# Patient Record
Sex: Male | Born: 1965 | Race: Black or African American | Hispanic: No | Marital: Single | Smoking: Never smoker
Health system: Southern US, Community
[De-identification: ages and names within clinical notes are randomized; demographics above are authoritative.]

## PROBLEM LIST (undated history)

## (undated) DIAGNOSIS — K429 Umbilical hernia without obstruction or gangrene: Secondary | ICD-10-CM

## (undated) DIAGNOSIS — M1990 Unspecified osteoarthritis, unspecified site: Secondary | ICD-10-CM

## (undated) DIAGNOSIS — M5430 Sciatica, unspecified side: Secondary | ICD-10-CM

## (undated) DIAGNOSIS — I10 Essential (primary) hypertension: Secondary | ICD-10-CM

## (undated) DIAGNOSIS — M549 Dorsalgia, unspecified: Secondary | ICD-10-CM

## (undated) HISTORY — PX: COLON SURGERY: SHX602

## (undated) HISTORY — DX: Unspecified osteoarthritis, unspecified site: M19.90

## (undated) HISTORY — DX: Dorsalgia, unspecified: M54.9

## (undated) HISTORY — DX: Umbilical hernia without obstruction or gangrene: K42.9

## (undated) HISTORY — DX: Sciatica, unspecified side: M54.30

## (undated) HISTORY — PX: KNEE SURGERY: SHX244

---

## 2018-09-06 ENCOUNTER — Encounter: Payer: Self-pay | Admitting: Emergency Medicine

## 2018-09-06 ENCOUNTER — Emergency Department: Payer: Self-pay

## 2018-09-06 ENCOUNTER — Other Ambulatory Visit: Payer: Self-pay

## 2018-09-06 ENCOUNTER — Emergency Department
Admission: EM | Admit: 2018-09-06 | Discharge: 2018-09-06 | Disposition: A | Discharge status: Home / Self Care | Payer: Self-pay | Attending: Emergency Medicine | Admitting: Emergency Medicine

## 2018-09-06 DIAGNOSIS — R5383 Other fatigue: Secondary | ICD-10-CM | POA: Insufficient documentation

## 2018-09-06 DIAGNOSIS — I10 Essential (primary) hypertension: Secondary | ICD-10-CM | POA: Insufficient documentation

## 2018-09-06 DIAGNOSIS — R079 Chest pain, unspecified: Secondary | ICD-10-CM

## 2018-09-06 DIAGNOSIS — R0600 Dyspnea, unspecified: Secondary | ICD-10-CM | POA: Insufficient documentation

## 2018-09-06 DIAGNOSIS — R0789 Other chest pain: Secondary | ICD-10-CM | POA: Insufficient documentation

## 2018-09-06 HISTORY — DX: Essential (primary) hypertension: I10

## 2018-09-06 LAB — CBC
HCT: 37.5 % — ABNORMAL LOW (ref 40.0–52.0)
Hemoglobin: 13.2 g/dL (ref 13.0–18.0)
MCH: 33 pg (ref 26.0–34.0)
MCHC: 35.1 g/dL (ref 32.0–36.0)
MCV: 94 fL (ref 80.0–100.0)
PLATELETS: 179 10*3/uL (ref 150–440)
RBC: 3.98 MIL/uL — AB (ref 4.40–5.90)
RDW: 13.3 % (ref 11.5–14.5)
WBC: 7.7 10*3/uL (ref 3.8–10.6)

## 2018-09-06 LAB — COMPREHENSIVE METABOLIC PANEL
ALBUMIN: 3.8 g/dL (ref 3.5–5.0)
ALT: 16 U/L (ref 0–44)
ANION GAP: 8 (ref 5–15)
AST: 20 U/L (ref 15–41)
Alkaline Phosphatase: 29 U/L — ABNORMAL LOW (ref 38–126)
BILIRUBIN TOTAL: 0.7 mg/dL (ref 0.3–1.2)
BUN: 20 mg/dL (ref 6–20)
CHLORIDE: 105 mmol/L (ref 98–111)
CO2: 24 mmol/L (ref 22–32)
Calcium: 8.6 mg/dL — ABNORMAL LOW (ref 8.9–10.3)
Creatinine, Ser: 1.22 mg/dL (ref 0.61–1.24)
GFR calc Af Amer: >60 mL/min (ref >60)
GFR calc non Af Amer: >60 mL/min (ref >60)
GLUCOSE: 121 mg/dL — AB (ref 70–99)
POTASSIUM: 3.5 mmol/L (ref 3.5–5.1)
Sodium: 137 mmol/L (ref 135–145)
TOTAL PROTEIN: 7.9 g/dL (ref 6.5–8.1)

## 2018-09-06 LAB — TROPONIN I

## 2018-09-06 NOTE — ED Notes (Signed)
Patient to xray.

## 2018-09-06 NOTE — ED Triage Notes (Signed)
Chest pain, SOB and fatigue x 2 days.

## 2018-09-06 NOTE — ED Provider Notes (Signed)
[facility] Emergency Department Provider Note  Time seen: 8:30 AM  I have reviewed the triage vital signs and the nursing notes.   HISTORY  Chief Complaint Chest Pain    HPI Jose Wood is a 52 y.o. male with a past medical history of hypertension presents to the emergency department for 2 days of shortness of breath intermittent chest discomfort and fatigue.  According to the patient for the past 2 days he has been feeling very tired and fatigued which she states is very abnormal for him.  He also states he has been feeling somewhat short of breath especially with prolonged exertion.  Normally is very fit and goes to the gym, but states he has been getting short of breath recently.  Also states intermittently he will have a very slight discomfort in the left chest although denies any currently.  No leg pain or swelling.  No nausea or diaphoresis.  Largely negative review of systems otherwise.  Does state very mild cough.   Past Medical History:  Diagnosis Date  . Hypertension     There are no active problems to display for this patient.   Past Surgical History:  Procedure Laterality Date  . COLON SURGERY    . KNEE SURGERY      Prior to Admission medications   Not on File    No Known Allergies  No family history on file.  Social History Social History   Tobacco Use  . Smoking status: Never Smoker  Substance Use Topics  . Alcohol use: Not on file  . Drug use: Not on file    Review of Systems Constitutional: Negative for fever. ENT: Negative for recent illness/congestion Cardiovascular: Intermittent slight left chest discomfort Respiratory: Positive for shortness of breath with exertion only Gastrointestinal: Negative for abdominal pain.  Negative for nausea vomiting diarrhea Genitourinary: Negative for urinary compaints Musculoskeletal: Negative for leg pain or swelling Skin: Negative for skin complaints  Neurological: Negative for  headache All other ROS negative  ____________________________________________   PHYSICAL EXAM:  VITAL SIGNS: ED Triage Vitals  Enc Vitals Group     BP 09/06/18 0747 130/88     Pulse Rate 09/06/18 0747 71     Resp 09/06/18 0747 18     Temp 09/06/18 0747 98.1 F (36.7 C)     Temp Source 09/06/18 0747 Oral     SpO2 09/06/18 0747 98 %     Weight 09/06/18 0748 257 lb (116.6 kg)     Height 09/06/18 0748 6\' 2"  (1.88 m)     Head Circumference --      Peak Flow --      Pain Score 09/06/18 0748 4     Pain Loc --      Pain Edu? --      Excl. in GC? --     Constitutional: Alert and oriented. Well appearing and in no distress. Eyes: Normal exam ENT   Head: Normocephalic and atraumatic   Mouth/Throat: Mucous membranes are moist. Cardiovascular: Normal rate, regular rhythm. No murmur Respiratory: Normal respiratory effort without tachypnea nor retractions. Breath sounds are clear and equal bilaterally.  Gastrointestinal: Soft and nontender. No distention Musculoskeletal: Nontender with normal range of motion in all extremities.  No lower extremity edema or tenderness. Neurologic:  Normal speech and language. No gross focal neurologic deficits are appreciated. Skin:  Skin is warm, dry and intact.  Psychiatric: Mood and affect are normal. Speech and behavior are normal.   ____________________________________________  EKG  EKG reviewed and interpreted by myself shows normal sinus rhythm at 67 bpm with a narrow QRS, normal axis, normal intervals, no ST changes.  Normal EKG.  ____________________________________________    RADIOLOGY  Chest x-ray is negative  ____________________________________________   INITIAL IMPRESSION / ASSESSMENT AND PLAN / ED COURSE  Pertinent labs & imaging results that were available during my care of the patient were reviewed by me and considered in my medical decision making (see chart for details).  Patient presents emergency department  for shortness of breath with exertion, fatigue and intermittent left chest discomfort over the past several days.  Currently the patient appears well, no distress.  Differential would include ACS, CHF, pneumonia, pneumothorax, URI.  We will check labs, chest x-ray and continue to closely monitor.  Patient is EKG is reassuring.  Chest x-ray is negative.  Labs are largely within normal limits including a negative troponin.  It is not entirely clear the cause of the patient's symptoms, could possibly be viral type infection.  We will have the patient follow-up with his primary care doctor.  ____________________________________________   FINAL CLINICAL IMPRESSION(S) / ED DIAGNOSES  Dyspnea Chest pain    Minna AntisPaduchowski, [name], MD 09/06/18 [phone]

## 2018-09-24 DIAGNOSIS — I10 Essential (primary) hypertension: Secondary | ICD-10-CM | POA: Insufficient documentation

## 2018-09-24 DIAGNOSIS — M5489 Other dorsalgia: Secondary | ICD-10-CM | POA: Insufficient documentation

## 2018-10-15 ENCOUNTER — Ambulatory Visit (INDEPENDENT_AMBULATORY_CARE_PROVIDER_SITE_OTHER): Payer: Self-pay | Admitting: Surgery

## 2018-10-15 ENCOUNTER — Other Ambulatory Visit: Payer: Self-pay

## 2018-10-15 ENCOUNTER — Encounter: Payer: Self-pay | Admitting: Surgery

## 2018-10-15 VITALS — BP 141/91 | HR 65 | Temp 98.3°F | Ht 74.0 in | Wt 266.0 lb

## 2018-10-15 DIAGNOSIS — K432 Incisional hernia without obstruction or gangrene: Secondary | ICD-10-CM | POA: Insufficient documentation

## 2018-10-15 NOTE — Progress Notes (Signed)
10/15/2018  Reason for Visit:  Incisional hernia  Referring Provider:  Einar Crow, MD  History of Present Illness: Jose Wood is a 52 y.o. male sent for evaluation of an incisional hernia.  The patient reports that in 2017 he had emergency exploratory laparotomy for bowel perforation, but he is unsure what segment was perforated.  He had a left sided colostomy which was eventually reversed in 2017 as well.  He has noticed since last year that he's developed a bulging at his midline that protrudes towards the right side of his abdomen.  However, he remains asymptomatic from it.  He was told initially by his surgery team that it was too early to fix anything and if it was asymptomatic, there was no urgency either.  However, he believes it is getting slowly larger in size.  He wears an abdominal binder to help with the bulging.  He does some lifting at work and does work out often.  He denies any nausea, vomiting, abdominal pain, constipation, or diarrhea.  Past Medical History: Past Medical History:  Diagnosis Date  . Back pain with sciatica   . Hypertension   . Osteoarthritis   . Umbilical hernia      Past Surgical History: Past Surgical History:  Procedure Laterality Date  . COLON SURGERY    . KNEE SURGERY      Home Medications: Prior to Admission medications   Medication Sig Start Date End Date Taking? Authorizing Provider  baclofen (LIORESAL) 10 MG tablet Take 10 mg by mouth at bedtime as needed for muscle spasms. 07/21/18   [provider]  lisinopril-hydrochlorothiazide (PRINZIDE,ZESTORETIC) 20-25 MG tablet Take 1 tablet by mouth daily. 08/30/18   [provider]  meloxicam (MOBIC) 15 MG tablet Take 15 mg by mouth daily. 08/19/18   [provider]  Multiple Vitamins-Minerals (MENS MULTIVITAMIN PLUS PO) Take 1 tablet by mouth daily.    [provider]    Allergies: No Known Allergies  Social History:  reports that he has never smoked.  He has never used smokeless tobacco. He reports that he does not drink alcohol or use drugs.   Family History: Family History  Problem Relation Age of Onset  . Cancer Mother   . Stroke Paternal Grandmother     Review of Systems: Review of Systems  Constitutional: Negative for chills and fever.  HENT: Negative for hearing loss.   Eyes: Negative for blurred vision.  Respiratory: Negative for shortness of breath.   Cardiovascular: Negative for chest pain.  Gastrointestinal: Negative for abdominal pain, constipation, diarrhea, nausea and vomiting.  Genitourinary: Negative for dysuria.  Musculoskeletal: Negative for myalgias.  Skin: Negative for rash.  Neurological: Negative for dizziness.  Psychiatric/Behavioral: Negative for depression.    Physical Exam BP (!) 141/91   Pulse 65   Temp 98.3 F (36.8 C) (Temporal)   Ht 6\' 2"  (1.88 m)   Wt 266 lb (120.7 kg)   BMI 34.15 kg/m  CONSTITUTIONAL: No acute distress HEENT:  Normocephalic, atraumatic, extraocular motion intact. NECK: Trachea is midline, and there is no jugular venous distension.  RESPIRATORY:  Lungs are clear, and breath sounds are equal bilaterally. Normal respiratory effort without pathologic use of accessory muscles. CARDIOVASCULAR: Heart is regular without murmurs, gallops, or rubs. GI: The abdomen is soft, nondistended, nontender to palpation.  The patient has a moderate size incisional hernia centered around the umbilicus.  He has a large midline scar from subxyphoid to pubis and a well healed left colostomy scar without  hernia. The incisional hernia is reducible and without any tenderness on palpation. MUSCULOSKELETAL:  Normal muscle strength and tone in all four extremities.  No peripheral edema or cyanosis. SKIN: Skin turgor is normal. There are no pathologic skin lesions.  NEUROLOGIC:  Motor and sensation is grossly normal.  Cranial nerves are grossly intact. PSYCH:  Alert and oriented to person, place and time.  Affect is normal.  Laboratory Analysis: No results found for this or any previous visit (from the past 24 hour(s)).  Imaging: No results found.  Assessment and Plan: [name] with an incisional hernia from prior exploratory laparotomy.  From the patient's description, I imagine he had an episode of perforated diverticulitis requiring Hartmann's procedure and eventual colostomy takedown.  However, we do not have medical records available.  Discussed with the patient that he has an incisional hernia and discussed with him what this means and what risks it poses.  Discussed with him that since he is currently asymptomatic, there is no urgency to repair the hernia as initially he seemed against surgical intervention.  He would not want another big incision for his hernia repair.  Discussed with the patient that it is possible to do this hernia repair via laparoscopy, with mesh placement, but that I could not guarantee that an open procedure would not be required, and this would be an OR time decision.  Discussed with the patient that robotic surgery may give more feasibility for dissection of the hernia contents and closure of his defect and mesh placement, but also this would not guarantee that there would not be an open procedure.  The patient does want to explore options as he is more hesitant about an open procedure given that his recovery from his other two surgeries took a long time.  He would want to discuss the option of robotic surgery.  I informed him that my partner Jose Wood, would be happy to see him for another opinion.  Then if he decides to proceed with surgery, either myself or my partner would be happy to help him.   We will set up appointment with Jose Wood.  Face-to-face time spent with the patient and care providers was 60 minutes, with more than 50% of the time spent counseling, educating, and coordinating care of the patient.     Jose Ill, MD [facility]

## 2018-10-15 NOTE — Patient Instructions (Addendum)
Please see your follow up appointment listed below.  °

## 2018-10-30 ENCOUNTER — Ambulatory Visit (INDEPENDENT_AMBULATORY_CARE_PROVIDER_SITE_OTHER): Payer: PRIVATE HEALTH INSURANCE | Admitting: Surgery

## 2018-10-30 ENCOUNTER — Encounter: Payer: Self-pay | Admitting: Surgery

## 2018-10-30 ENCOUNTER — Encounter: Payer: Self-pay | Admitting: *Deleted

## 2018-10-30 VITALS — BP 142/95 | HR 80 | Temp 97.9°F | Ht 73.5 in | Wt 269.0 lb

## 2018-10-30 DIAGNOSIS — K432 Incisional hernia without obstruction or gangrene: Secondary | ICD-10-CM | POA: Diagnosis not present

## 2018-10-30 NOTE — Patient Instructions (Addendum)
We will follow up after your CT scan.

## 2018-10-30 NOTE — Progress Notes (Signed)
Patient has been scheduled for a CT abdomen/pelvis with contrast at [place] for 11-01-18 at 9 am (arrive 8:45 am). Prep: NPO 4 hours prior and pick up prep kit. Patient verbalizes understanding.  The patient will follow up with Dr. Dahlia Byes in the office in 2 weeks as scheduled.   I have faxed a medical records request to [facility] to see if we can obtain records from the 2017 colon surgeries done around March and September.

## 2018-10-30 NOTE — Progress Notes (Signed)
Patient ID: Jose Wood, male   DOB: Nov 07, 1966, 52 y.o.   MRN: [id number]  HPI Jose Wood is a 52 y.o. male recently seen by my partner Dr. Aleen Campi and requesting my opinion regarding operative approach for his complex abdominal wall defect.  Patient reports that 2 years ago he had an emergency surgery for perforated diverticulitis at [facility] requiring Hartman's procedure and prolonged hospitalization.  He had a colostomy takedown subsequently and did well but developed a ventral hernia.  He reports that over the last couple years his hernia defect has increased in size there is some mild discomfort but no major pain. He is able to perform more than 4 METS of activity without any shortness of breath or chest pain.  Is not diabetic and he does not smoke.  There is a binder for his abdominal comfort.  HPI  Past Medical History:  Diagnosis Date  . Back pain with sciatica   . Hypertension   . Osteoarthritis   . Umbilical hernia     Past Surgical History:  Procedure Laterality Date  . COLON SURGERY    . KNEE SURGERY      Family History  Problem Relation Age of Onset  . Cancer Mother   . Stroke Paternal Grandmother     Social History Social History   Tobacco Use  . Smoking status: Never Smoker  . Smokeless tobacco: Never Used  Substance Use Topics  . Alcohol use: Never    Frequency: Never  . Drug use: Never    No Known Allergies  Current Outpatient Medications  Medication Sig Dispense Refill  . baclofen (LIORESAL) 10 MG tablet     . lisinopril-hydrochlorothiazide (PRINZIDE,ZESTORETIC) 20-25 MG tablet     . meloxicam (MOBIC) 15 MG tablet Take 15 mg by mouth daily.  1  . Multiple Vitamins-Minerals (MENS MULTIVITAMIN PLUS PO) Take 1 tablet by mouth daily.     No current facility-administered medications for this visit.      Review of Systems Full ROS  was asked and was negative except for the information on the HPI  Physical Exam Blood pressure (!) 142/95,  pulse 80, temperature 97.9 F (36.6 C), temperature source Temporal, height 6' 1.5" (1.867 m), weight 269 lb (122 kg). CONSTITUTIONAL: NAD EYES: Pupils are equal, round, and reactive to light, Sclera are non-icteric. EARS, NOSE, MOUTH AND THROAT: The oropharynx is clear. The oral mucosa is pink and moist. Hearing is intact to voice. LYMPH NODES:  Lymph nodes in the neck are normal. RESPIRATORY:  Lungs are clear. There is normal respiratory effort, with equal breath sounds bilaterally, and without pathologic use of accessory muscles. CARDIOVASCULAR: Heart is regular without murmurs, gallops, or rubs. GI: The abdomen is soft, nontender, and nondistended. There is a large abdominal wall defect measuring 15 x 12 cm.  There is loss of domain and retraction of the rectus abdominal muscle laterally.  No peritonitis, no infection  There are no palpable masses. There is no hepatosplenomegaly. There are normal bowel sounds in all quadrants. GU: Rectal deferred.   MUSCULOSKELETAL: Normal muscle strength and tone. No cyanosis or edema.   SKIN: Turgor is good and there are no pathologic skin lesions or ulcers. NEUROLOGIC: Motor and sensation is grossly normal. Cranial nerves are grossly intact. PSYCH:  Oriented to person, place and time. Affect is normal.  Data Reviewed I have personally reviewed the patient's imaging, laboratory findings and medical records.    Assessment/Plan 52 year old male with large abdominal wall defect.  I will obtain records from [facility] regarding his previous operations.  Given the size of the defect I do think that he will likely require abdominal wall reconstruction.  I will obtain a CT scan of the abdomen and pelvis to evaluate his abdominal wall anatomy. Had a lengthy discussion with patient regarding different approaches to ventral hernia surgery.  Including but not limited to robotic approach is medically invasive as well as a component separation.  Extensive counseling  was provided  Time spent with the patient was , with more than 50% of the time spent in face-to-face education, counseling and care coordination.     Sterling Big, MD FACS General Surgeon 10/30/2018, 1:13 PM

## 2018-11-01 ENCOUNTER — Ambulatory Visit
Admission: RE | Admit: 2018-11-01 | Discharge: 2018-11-01 | Disposition: A | Discharge status: Home / Self Care | Payer: PRIVATE HEALTH INSURANCE | Source: Ambulatory Visit | Attending: Surgery | Admitting: Surgery

## 2018-11-01 DIAGNOSIS — K432 Incisional hernia without obstruction or gangrene: Secondary | ICD-10-CM | POA: Insufficient documentation

## 2018-11-01 DIAGNOSIS — N281 Cyst of kidney, acquired: Secondary | ICD-10-CM | POA: Insufficient documentation

## 2018-11-01 DIAGNOSIS — K439 Ventral hernia without obstruction or gangrene: Secondary | ICD-10-CM | POA: Insufficient documentation

## 2018-11-01 DIAGNOSIS — I700 Atherosclerosis of aorta: Secondary | ICD-10-CM | POA: Diagnosis not present

## 2018-11-01 MED ORDER — IOPAMIDOL (ISOVUE-300) INJECTION 61%
100.0000 mL | Freq: Once | INTRAVENOUS | Status: AC | PRN
  Administered 2018-11-01: 100 mL via INTRAVENOUS

## 2018-11-04 ENCOUNTER — Encounter: Payer: Self-pay | Admitting: *Deleted

## 2018-11-04 NOTE — Progress Notes (Signed)
Records received from [facility] and given to Irving Burton to put with chart for patient's upcoming appointment with Dr. Everlene Farrier on 11-13-18 at 9 am.

## 2018-11-13 ENCOUNTER — Encounter: Payer: Self-pay | Admitting: Surgery

## 2018-11-13 ENCOUNTER — Ambulatory Visit (INDEPENDENT_AMBULATORY_CARE_PROVIDER_SITE_OTHER): Payer: PRIVATE HEALTH INSURANCE | Admitting: Surgery

## 2018-11-13 ENCOUNTER — Other Ambulatory Visit: Payer: Self-pay

## 2018-11-13 VITALS — BP 130/86 | HR 73 | Temp 97.7°F | Ht 72.0 in | Wt 268.0 lb

## 2018-11-13 DIAGNOSIS — K432 Incisional hernia without obstruction or gangrene: Secondary | ICD-10-CM

## 2018-11-13 NOTE — Progress Notes (Signed)
F/U for large ventral hernia w loss of domain. Ct scan pers reviewed and images shared in detail w the pat. Loss of domain, there is a second hernia where the stoma used to be.  I had a lengthy d/w the pt about his situation. Given the massive size of the hernia, the second component from the previous ostomy and loss of domain I do recommend Open ventral hernia repair with Component separation TAR. D/W the pt at length about what this will entail. THis would be a big operation, risks, benefits and possible complications d/w the pt in detail.  For now e wishes to think about it a call us back after the holidays. I spent at least 25 min  In this encounter w > 50% spent in coordination and counseling of his care.

## 2018-11-13 NOTE — Patient Instructions (Signed)
Patient to call back and scheduled surgery when he is ready .

## 2019-02-26 ENCOUNTER — Telehealth: Payer: Self-pay | Admitting: *Deleted

## 2019-02-26 ENCOUNTER — Other Ambulatory Visit
Admission: RE | Admit: 2019-02-26 | Discharge: 2019-02-26 | Disposition: A | Discharge status: Home / Self Care | Payer: PRIVATE HEALTH INSURANCE | Source: Ambulatory Visit | Attending: Surgery | Admitting: Surgery

## 2019-02-26 ENCOUNTER — Ambulatory Visit (INDEPENDENT_AMBULATORY_CARE_PROVIDER_SITE_OTHER): Payer: PRIVATE HEALTH INSURANCE | Admitting: Surgery

## 2019-02-26 ENCOUNTER — Encounter: Payer: Self-pay | Admitting: Surgery

## 2019-02-26 ENCOUNTER — Ambulatory Visit
Admission: RE | Admit: 2019-02-26 | Discharge: 2019-02-26 | Disposition: A | Discharge status: Home / Self Care | Payer: PRIVATE HEALTH INSURANCE | Source: Ambulatory Visit | Attending: Surgery | Admitting: Surgery

## 2019-02-26 ENCOUNTER — Other Ambulatory Visit: Payer: Self-pay

## 2019-02-26 VITALS — BP 144/95 | HR 73 | Temp 97.7°F | Ht 73.5 in | Wt 264.8 lb

## 2019-02-26 DIAGNOSIS — K439 Ventral hernia without obstruction or gangrene: Secondary | ICD-10-CM | POA: Diagnosis not present

## 2019-02-26 DIAGNOSIS — R9431 Abnormal electrocardiogram [ECG] [EKG]: Secondary | ICD-10-CM | POA: Insufficient documentation

## 2019-02-26 DIAGNOSIS — Z01810 Encounter for preprocedural cardiovascular examination: Secondary | ICD-10-CM

## 2019-02-26 DIAGNOSIS — Z01818 Encounter for other preprocedural examination: Secondary | ICD-10-CM | POA: Insufficient documentation

## 2019-02-26 LAB — COMPREHENSIVE METABOLIC PANEL
ALBUMIN: 4.5 g/dL (ref 3.5–5.0)
ALT: 19 U/L (ref 0–44)
AST: 25 U/L (ref 15–41)
Alkaline Phosphatase: 35 U/L — ABNORMAL LOW (ref 38–126)
Anion gap: 10 (ref 5–15)
BUN: 19 mg/dL (ref 6–20)
CHLORIDE: 101 mmol/L (ref 98–111)
CO2: 27 mmol/L (ref 22–32)
CREATININE: 1.21 mg/dL (ref 0.61–1.24)
Calcium: 9.3 mg/dL (ref 8.9–10.3)
GFR calc non Af Amer: >60 mL/min (ref >60)
GLUCOSE: 99 mg/dL (ref 70–99)
Potassium: 4.3 mmol/L (ref 3.5–5.1)
SODIUM: 138 mmol/L (ref 135–145)
Total Bilirubin: 0.8 mg/dL (ref 0.3–1.2)
Total Protein: 8.3 g/dL — ABNORMAL HIGH (ref 6.5–8.1)

## 2019-02-26 LAB — CBC WITH DIFFERENTIAL/PLATELET
ABS IMMATURE GRANULOCYTES: 0.01 10*3/uL (ref 0.00–0.07)
BASOS ABS: 0 10*3/uL (ref 0.0–0.1)
BASOS PCT: 0 %
Eosinophils Absolute: 0.1 10*3/uL (ref 0.0–0.5)
Eosinophils Relative: 2 %
HCT: 42.4 % (ref 39.0–52.0)
Hemoglobin: 14.7 g/dL (ref 13.0–17.0)
IMMATURE GRANULOCYTES: 0 %
Lymphocytes Relative: 38 %
Lymphs Abs: 2.5 10*3/uL (ref 0.7–4.0)
MCH: 32 pg (ref 26.0–34.0)
MCHC: 34.7 g/dL (ref 30.0–36.0)
MCV: 92.4 fL (ref 80.0–100.0)
Monocytes Absolute: 0.6 10*3/uL (ref 0.1–1.0)
Monocytes Relative: 9 %
NEUTROS ABS: 3.4 10*3/uL (ref 1.7–7.7)
NEUTROS PCT: 51 %
PLATELETS: 205 10*3/uL (ref 150–400)
RBC: 4.59 MIL/uL (ref 4.22–5.81)
RDW: 12.2 % (ref 11.5–15.5)
WBC: 6.6 10*3/uL (ref 4.0–10.5)
nRBC: 0 % (ref 0.0–0.2)

## 2019-02-26 LAB — PROTIME-INR
INR: 1 (ref 0.8–1.2)
Prothrombin Time: 13.5 seconds (ref 11.4–15.2)

## 2019-02-26 NOTE — Telephone Encounter (Signed)
Patient to have the following done at [facility] today: CMP, CBC, INR, chest x-ray, and EKG.   Patient's surgery has been scheduled for 03-27-19 at at [facility] with Dr. Everlene Farrier. Dr. Aleen Campi to assist.   The patient is aware to Pre-Admit on 03-17-19 at 8 am. Patient will [facility] in [facility], [address] (first floor).   The patient is aware to call the office should he have further questions.   *The patient was referred to [facility] and was scheduled for an appointment to see Jennet Maduro, PA (under Dr. Hyacinth Meeker) for 03-05-19 at 8:30 am. Patient reports that he went to urgent care today for left shoulder and does not need this appointment. I will call to cancel tomorrow when they are back in the office. Note to Dr. Everlene Farrier.

## 2019-02-26 NOTE — Patient Instructions (Addendum)
Patient will need to be scheduled for surgery for hernia repair. He will need to have a Chest Xray, EKG and lab work (CMP,CBC,INR). Refer patient to ortho for left shoulder pain. (Dr.Miller or Dr.Poggi)  Call the office with any questions or concerns.      Open Hernia Repair, Adult  Open hernia repair is a surgical procedure to fix a hernia. A hernia occurs when an internal organ or tissue pushes out through a weak spot in the abdominal wall muscles. Hernias commonly occur in the groin and around the navel. Most hernias tend to get worse over time. Often, surgery is done to prevent the hernia from becoming bigger, uncomfortable, or an emergency. Emergency surgery may be needed if abdominal contents get stuck in the opening (incarcerated hernia) or the blood supply gets cut off (strangulated hernia). In an open repair, an incision is made in the abdomen to perform the surgery. Tell a health care provider about:  Any allergies you have.  All medicines you are taking, including vitamins, herbs, eye drops, creams, and over-the-counter medicines.  Any problems you or family members have had with anesthetic medicines.  Any blood or bone disorders you have.  Any surgeries you have had.  Any medical conditions you have, including any recent cold or flu symptoms.  Whether you are pregnant or may be pregnant. What are the risks? Generally, this is a safe procedure. However, problems may occur, including:  Long-lasting (chronic) pain.  Bleeding.  Infection.  Damage to the testicle. This can cause shrinking or swelling.  Damage to the bladder, blood vessels, intestine, or nerves near the hernia.  Trouble passing urine.  Allergic reactions to medicines.  Return of the hernia. What happens before the procedure? Staying hydrated Follow instructions from your health care provider about hydration, which may include:  Up to 2 hours before the procedure - you may continue to drink clear  liquids, such as water, clear fruit juice, black coffee, and plain tea. Eating and drinking restrictions Follow instructions from your health care provider about eating and drinking, which may include:  8 hours before the procedure - stop eating heavy meals or foods such as meat, fried foods, or fatty foods.  6 hours before the procedure - stop eating light meals or foods, such as toast or cereal.  6 hours before the procedure - stop drinking milk or drinks that contain milk.  2 hours before the procedure - stop drinking clear liquids. Medicines  Ask your health care provider about: ? Changing or stopping your regular medicines. This is especially important if you are taking diabetes medicines or blood thinners. ? Taking medicines such as aspirin and ibuprofen. These medicines can thin your blood. Do not take these medicines before your procedure if your health care provider instructs you not to.  You may be given antibiotic medicine to help prevent infection. General instructions  You may have blood tests or imaging studies.  Ask your health care provider how your surgical site will be marked or identified.  If you smoke, do not smoke for at least 2 weeks before your procedure or for as long as told by your health care provider.  Let your health care provider know if you develop a cold or any infection before your surgery.  Plan to have someone take you home from the hospital or clinic.  If you will be going home right after the procedure, plan to have someone with you for 24 hours. What happens during the procedure?  To reduce your risk of infection: ? Your health care team will wash or sanitize their hands. ? Your skin will be washed with soap. ? Hair may be removed from the surgical area.  An IV tube will be inserted into one of your veins.  You will be given one or more of the following: ? A medicine to help you relax (sedative). ? A medicine to numb the area (local  anesthetic). ? A medicine to make you fall asleep (general anesthetic).  Your surgeon will make an incision over the hernia.  The tissues of the hernia will be moved back into place.  The edges of the hernia may be stitched together.  The opening in the abdominal muscles will be closed with stitches (sutures). Or, your surgeon will place a mesh patch made of manmade (synthetic) material over the opening.  The incision will be closed.  A bandage (dressing) may be placed over the incision. The procedure may vary among health care providers and hospitals. What happens after the procedure?  Your blood pressure, heart rate, breathing rate, and blood oxygen level will be monitored until the medicines you were given have worn off.  You may be given medicine for pain.  Do not drive for 24 hours if you received a sedative. This information is not intended to replace advice given to you by your health care provider. Make sure you discuss any questions you have with your health care provider. Document Released: 06/06/2001 Document Revised: 06/30/2016 Document Reviewed: 05/24/2016 Elsevier Interactive Patient Education  2019 [employer].

## 2019-02-27 ENCOUNTER — Encounter: Payer: Self-pay | Admitting: *Deleted

## 2019-02-27 ENCOUNTER — Telehealth: Payer: Self-pay

## 2019-02-27 NOTE — Progress Notes (Signed)
I did call [facility] this morning and speak with Robin. She cancelled patient's appointment with Jennet Maduro, PA for next week.

## 2019-02-27 NOTE — Telephone Encounter (Signed)
Called patient to inform him of his results per Dr.Pabon unable to LVM due to mailbox being full.

## 2019-02-27 NOTE — Progress Notes (Signed)
Patient has been informed of his lab results and cxr.

## 2019-02-28 ENCOUNTER — Encounter: Payer: Self-pay | Admitting: Surgery

## 2019-02-28 NOTE — Progress Notes (Signed)
Surgical Consultation  02/28/2019  Jose Wood is an 53 y.o. male.   Chief Complaint  Patient presents with  . Follow-up    f/u hernia, to discuss possibly scheduling hernia surgery     HPI: 53 year old male well-known to me with a prior history of perforated diverticulitis requiring emergent Hartmann procedure at outside hospital and colostomy takedown consequently.  I had had an encounter with the patient before explaining to them about the need for abdominal wall reconstruction given the massive hernia on the loss of domain.  This will reinstitute the abdominal wall anatomy on dynamics. He is otherwise doing well.  No fevers no chills no evidence of recent heart attack strokes.  He does report some left shoulder pain.  Pain is intermittent and worsening with movement.  He does have decreased range of motion on the left side  Past Medical History:  Diagnosis Date  . Back pain with sciatica   . Hypertension   . Osteoarthritis   . Umbilical hernia     Past Surgical History:  Procedure Laterality Date  . COLON SURGERY    . KNEE SURGERY      Family History  Problem Relation Age of Onset  . Cancer Mother   . Stroke Paternal Grandmother     Social History:  reports that he has never smoked. He has never used smokeless tobacco. He reports that he does not drink alcohol or use drugs.  Allergies: No Known Allergies  Medications reviewed.     ROS Full ROS performed and is otherwise negative other than what is stated in the HPI    BP (!) 144/95   Pulse 73   Temp 97.7 F (36.5 C) (Temporal)   Ht 6' 1.5" (1.867 m)   Wt 264 lb 12.8 oz (120.1 kg)   SpO2 97%   BMI 34.46 kg/m   Physical Exam Vitals signs and nursing note reviewed.  Constitutional:      Appearance: Normal appearance.  Neck:     Musculoskeletal: Normal range of motion.  Cardiovascular:     Rate and Rhythm: Normal rate and regular rhythm.  Pulmonary:     Effort: Pulmonary effort is normal.   Breath sounds: Normal breath sounds. No stridor.  Abdominal:     Tenderness: There is no abdominal tenderness. There is no guarding or rebound.     Hernia: A hernia is present.     Comments: Patient with a massive ventral hernia with loss of domain.  It is reducible.  There is no evidence of infection there is no evidence of peritonitis  Skin:    General: Skin is warm and dry.     Capillary Refill: Capillary refill takes less than 2 seconds.     Coloration: Skin is not jaundiced.  Neurological:     General: No focal deficit present.     Mental Status: He is alert and oriented to person, place, and time. Mental status is at baseline.  Psychiatric:        Mood and Affect: Mood normal.        Behavior: Behavior normal.        Thought Content: Thought content normal.        Judgment: Judgment normal.       Assessment/Plan: 1. Ventral hernia without obstruction or gangrene  DG Chest 2 View; Future - Comprehensive metabolic panel; Future - CBC with Differential/Platelet; Future - Protime-INR; Future - EKG 12-Lead; Future  2. Pre-operative cardiovascular examination - EKG 12-Lead; Future  53 year old  male with large ventral hernia and with loss of domain.  She has thought about his condition and he wishes surgical intervention.  I have discussed with the patient in detail about abdominal wall reconstruction with mesh.  The risks, the benefits possible complications including but not limited to: Bleeding, infection, bowel injuries, chronic pain, chronic seroma, infection, mesh issues and potential re-interventions. They understand and wish to proceed.  He does have some left shoulder pain and I will refer him to orthopedics as well as to perform perioperative EKG and chest x-ray as well as nutritional labs. This note that I spent over 40 minutes in this encounter with greater than 50% spent in coordination and counseling of his care  Sterling Big, MD [facility] General Surgeon

## 2019-03-13 ENCOUNTER — Telehealth: Payer: Self-pay

## 2019-03-13 NOTE — Telephone Encounter (Signed)
The patient is scheduled for surgery at [facility] with Dr Everlene Farrier. Patient contacted today and due to recent COVID-19 restrictions, elective surgeries will need to be postponed.   The patient will be contacted once restrictions have been lifted regarding rescheduling.   Patient verbalizes understanding. He is aware to call us if he has any problems or increase in symptoms.

## 2019-03-17 ENCOUNTER — Inpatient Hospital Stay: Admission: RE | Admit: 2019-03-17 | Payer: PRIVATE HEALTH INSURANCE | Source: Ambulatory Visit

## 2019-03-27 ENCOUNTER — Inpatient Hospital Stay: Admit: 2019-03-27 | Payer: PRIVATE HEALTH INSURANCE | Admitting: Surgery

## 2019-05-09 ENCOUNTER — Telehealth: Payer: Self-pay | Admitting: *Deleted

## 2019-05-09 NOTE — Telephone Encounter (Signed)
Patient wants to  Scheduled ASAP [facility]

## 2019-05-14 ENCOUNTER — Telehealth: Payer: Self-pay | Admitting: *Deleted

## 2019-05-14 NOTE — Telephone Encounter (Signed)
Patient called wanting to know about scheduling a date for surgery with Dr. Everlene Farrier.   The patient was notified that due to COVID-19 restrictions we have taken a step backwards and are not able to schedule his elective surgery at this time.   Patient aware we will call when we are able to schedule his surgery. He is wanting surgery ASAP.

## 2019-05-22 ENCOUNTER — Encounter: Payer: Self-pay | Admitting: Surgery

## 2019-05-22 ENCOUNTER — Other Ambulatory Visit: Payer: Self-pay

## 2019-05-22 ENCOUNTER — Ambulatory Visit (INDEPENDENT_AMBULATORY_CARE_PROVIDER_SITE_OTHER): Payer: PRIVATE HEALTH INSURANCE | Admitting: Surgery

## 2019-05-22 VITALS — BP 120/84 | HR 67 | Temp 97.9°F | Resp 18 | Ht 74.0 in | Wt 272.6 lb

## 2019-05-22 DIAGNOSIS — K439 Ventral hernia without obstruction or gangrene: Secondary | ICD-10-CM

## 2019-05-22 NOTE — Patient Instructions (Addendum)
Schedule surgery  Ventral Hernia  A ventral hernia is a bulge of tissue from inside the abdomen that pushes through a weak area of the muscles that form the front wall of the abdomen. The tissues inside the abdomen are inside a sac (peritoneum). These tissues include the small intestine, large intestine, and the fatty tissue that covers the intestines (omentum). Sometimes, the bulge that forms a hernia contains intestines. Other hernias contain only fat. Ventral hernias do not go away without surgical treatment. There are several types of ventral hernias. You may have:  A hernia at an incision site from previous abdominal surgery (incisional hernia).  A hernia just above the belly button (epigastric hernia), or at the belly button (umbilical hernia). These types of hernias can develop from heavy lifting or straining.  A hernia that comes and goes (reducible hernia). It may be visible only when you lift or strain. This type of hernia can be pushed back into the abdomen (reduced).  A hernia that traps abdominal tissue inside the hernia (incarcerated hernia). This type of hernia does not reduce.  A hernia that cuts off blood flow to the tissues inside the hernia (strangulated hernia). The tissues can start to die if this happens. This is a very painful bulge that cannot be reduced. A strangulated hernia is a medical emergency. What are the causes? This condition is caused by abdominal tissue putting pressure on an area of weakness in the abdominal muscles. What increases the risk? The following factors may make you more likely to develop this condition:  Being male.  Being 48 or older.  Being overweight or obese.  Having had previous abdominal surgery, especially if there was an infection after surgery.  Having had an injury to the abdominal wall.  Having had several pregnancies.  Having a buildup of fluid inside the abdomen (ascites). What are the signs or symptoms? The only symptom  of a ventral hernia may be a painless bulge in the abdomen. A reducible hernia may be visible only when you strain, cough, or lift. Other symptoms may include:  Dull pain.  A feeling of pressure. Signs and symptoms of a strangulated hernia may include:  Increasing pain.  Nausea and vomiting.  Pain when pressing on the hernia.  The skin over the hernia turning red or purple.  Constipation.  Blood in the stool (feces). How is this diagnosed? This condition may be diagnosed based on:  Your symptoms.  Your medical history.  A physical exam. You may be asked to cough or strain while standing. These actions increase the pressure inside your abdomen and force the hernia through the opening in your muscles. Your health care provider may try to reduce the hernia by pressing on it.  Imaging studies, such as an ultrasound or CT scan. How is this treated? This condition is treated with surgery. If you have a strangulated hernia, surgery is done as soon as possible. If your hernia is small and not incarcerated, you may be asked to lose some weight before surgery. Follow these instructions at home:  Follow instructions from your health care provider about eating or drinking restrictions.  If you are overweight, your health care provider may recommend that you increase your activity level and eat a healthier diet.  Do not lift anything that is heavier than 10 lb (4.5 kg).  Return to your normal activities as told by your health care provider. Ask your health care provider what activities are safe for you. You may need  to avoid activities that increase pressure on your hernia.  Take over-the-counter and prescription medicines only as told by your health care provider.  Keep all follow-up visits as told by your health care provider. This is important. Contact a health care provider if:  Your hernia gets larger.  Your hernia becomes painful. Get help right away if:  Your hernia  becomes increasingly painful.  You have pain along with any of the following: ? Changes in skin color in the area of the hernia. ? Nausea. ? Vomiting. ? Fever. Summary  A ventral hernia is a bulge of tissue from inside the abdomen that pushes through a weak area of the muscles that form the front wall of the abdomen.  This condition is treated with surgery, which may be urgent depending on your hernia.  Do not lift anything that is heavier than 10 lb (4.5 kg), and follow activity instructions from your health care provider. This information is not intended to replace advice given to you by your health care provider. Make sure you discuss any questions you have with your health care provider. Document Released: 11/27/2012 Document Revised: 01/23/2018 Document Reviewed: 07/02/2017 Elsevier Interactive Patient Education  2019 [employer]

## 2019-05-22 NOTE — Progress Notes (Signed)
Outpatient Surgical Follow Up  05/22/2019  Jose Wood is an 52 y.o. male.   Chief Complaint  Patient presents with  . Follow-up    Ventral hernia    HPI: 52-year-old male well-known to me with a prior history of perforated diverticulitis requiring emergent Hartmann procedure at outside hospital and colostomy takedown consequently.  I had had an encounter with the patient before explaining to them about the need for abdominal wall reconstruction given the massive hernia on the loss of domain.  Now reports some pain on the left lower quadrant where the previous ostomy site wise.  No fevers no chills no evidence of incarceration or strangulation.  Past Medical History:  Diagnosis Date  . Back pain with sciatica   . Hypertension   . Osteoarthritis   . Umbilical hernia     Past Surgical History:  Procedure Laterality Date  . COLON SURGERY    . KNEE SURGERY      Family History  Problem Relation Age of Onset  . Cancer Mother   . Stroke Paternal Grandmother     Social History:  reports that he has never smoked. He has never used smokeless tobacco. He reports that he does not drink alcohol or use drugs.  Allergies: No Known Allergies  Medications reviewed.    ROS Full ROS performed and is otherwise negative other than what is stated in HPI   BP 120/84   Pulse 67   Temp 97.9 F (36.6 C) (Temporal)   Resp 18   Ht 6' 2" (1.88 m)   Wt 272 lb 9.6 oz (123.7 kg)   SpO2 98%   BMI 35.00 kg/m   Physical Exam Vitals signs and nursing note reviewed.  Constitutional:      General: He is not in acute distress.    Appearance: Normal appearance. He is normal weight.  Eyes:     General: No scleral icterus.       Right eye: No discharge.        Left eye: No discharge.  Neck:     Musculoskeletal: Normal range of motion and neck supple. No neck rigidity or muscular tenderness.  Cardiovascular:     Rate and Rhythm: Normal rate and regular rhythm.  Pulmonary:     Effort:  Pulmonary effort is normal. No respiratory distress.     Breath sounds: Normal breath sounds. No stridor.  Abdominal:     General: There is no distension.     Palpations: Abdomen is soft.     Tenderness: There is no abdominal tenderness. There is no guarding or rebound.     Comments: Large ventral hernia w loss of domain, reducible. Tender on previous LLQ where ostomy site was.  Musculoskeletal: Normal range of motion.        General: No swelling.  Skin:    General: Skin is warm and dry.     Capillary Refill: Capillary refill takes less than 2 seconds.  Neurological:     General: No focal deficit present.     Mental Status: He is alert and oriented to person, place, and time.  Psychiatric:        Mood and Affect: Mood normal.        Behavior: Behavior normal.        Thought Content: Thought content normal.        Judgment: Judgment normal.        Assessment/Plan: 52-year-old male with a symptomatic ventral hernia with loss of domain.  Discussed   with the patient in detail that the best option for this repair would be to perform an abdominal wall reconstruction with bilateral TAR myocutaneous flaps with mesh placement.  Procedure discussed with the patient in detail.  Risk, benefits and possible complications including but not limited to: Bleeding, infection, cosmetic deformity, recurrence, mesh infection, chronic pain and bowel injury were discussed with him in detail.  He understands and wishes to proceed. 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 I spent 30 minutes in this encounter with greater than 50% spent in counseling and coordination of care  Sterling Big, MD [facility] General Surgeon

## 2019-05-22 NOTE — H&P (View-Only) (Signed)
Outpatient Surgical Follow Up  05/22/2019  Jose LeavensJames A Wood is an 53 y.o. male.   Chief Complaint  Patient presents with  . Follow-up    Ventral hernia    HPI: 53 year old male well-known to me with a prior history of perforated diverticulitis requiring emergent Hartmann procedure at outside hospital and colostomy takedown consequently.  I had had an encounter with the patient before explaining to them about the need for abdominal wall reconstruction given the massive hernia on the loss of domain.  Now reports some pain on the left lower quadrant where the previous ostomy site wise.  No fevers no chills no evidence of incarceration or strangulation.  Past Medical History:  Diagnosis Date  . Back pain with sciatica   . Hypertension   . Osteoarthritis   . Umbilical hernia     Past Surgical History:  Procedure Laterality Date  . COLON SURGERY    . KNEE SURGERY      Family History  Problem Relation Age of Onset  . Cancer Mother   . Stroke Paternal Grandmother     Social History:  reports that he has never smoked. He has never used smokeless tobacco. He reports that he does not drink alcohol or use drugs.  Allergies: No Known Allergies  Medications reviewed.    ROS Full ROS performed and is otherwise negative other than what is stated in HPI   BP 120/84   Pulse 67   Temp 97.9 F (36.6 C) (Temporal)   Resp 18   Ht 6\' 2"  (1.88 m)   Wt 272 lb 9.6 oz (123.7 kg)   SpO2 98%   BMI 35.00 kg/m   Physical Exam Vitals signs and nursing note reviewed.  Constitutional:      General: He is not in acute distress.    Appearance: Normal appearance. He is normal weight.  Eyes:     General: No scleral icterus.       Right eye: No discharge.        Left eye: No discharge.  Neck:     Musculoskeletal: Normal range of motion and neck supple. No neck rigidity or muscular tenderness.  Cardiovascular:     Rate and Rhythm: Normal rate and regular rhythm.  Pulmonary:     Effort:  Pulmonary effort is normal. No respiratory distress.     Breath sounds: Normal breath sounds. No stridor.  Abdominal:     General: There is no distension.     Palpations: Abdomen is soft.     Tenderness: There is no abdominal tenderness. There is no guarding or rebound.     Comments: Large ventral hernia w loss of domain, reducible. Tender on previous LLQ where ostomy site was.  Musculoskeletal: Normal range of motion.        General: No swelling.  Skin:    General: Skin is warm and dry.     Capillary Refill: Capillary refill takes less than 2 seconds.  Neurological:     General: No focal deficit present.     Mental Status: He is alert and oriented to person, place, and time.  Psychiatric:        Mood and Affect: Mood normal.        Behavior: Behavior normal.        Thought Content: Thought content normal.        Judgment: Judgment normal.        Assessment/Plan: 53 year old male with a symptomatic ventral hernia with loss of domain.  Discussed  with the patient in detail that the best option for this repair would be to perform an abdominal wall reconstruction with bilateral TAR myocutaneous flaps with mesh placement.  Procedure discussed with the patient in detail.  Risk, benefits and possible complications including but not limited to: Bleeding, infection, cosmetic deformity, recurrence, mesh infection, chronic pain and bowel injury were discussed with him in detail.  He understands and wishes to proceed. 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 I spent 30 minutes in this encounter with greater than 50% spent in counseling and coordination of care  Sterling Big, MD [facility] General Surgeon

## 2019-05-23 ENCOUNTER — Telehealth: Payer: Self-pay | Admitting: *Deleted

## 2019-05-23 NOTE — Telephone Encounter (Signed)
Patient called the office today wanting to get surgery scheduled for 06-10-19 at [facility] with Dr. Everlene Farrier. Dr. Aleen Campi will be assisting with this case.   The patient is aware to have COVID-19 testing done on 06-06-19 at the [facility] drive thru ([address] [facility]) between 10:30 am and 12:30 pm. He is aware to isolate after, have no visitors, wash hands frequently, and avoid touching face.   The patient is aware he will be contacted by the Pre-Admission Testing Department to complete a phone interview sometime in the near future.  He is aware to check in at the [facility] entrance where he will be screened for the coronavirus and then sent to Same Day Surgery.   Patient to be contacted once [facility] has posted to confirm the above and review further details. I have requested that Caryl-Lyn follow up with the patient next week in my absence.

## 2019-05-27 NOTE — Telephone Encounter (Signed)
Spoke with the patient and he is aware of all dates, times, and instructions.

## 2019-06-04 ENCOUNTER — Other Ambulatory Visit: Payer: Self-pay

## 2019-06-04 ENCOUNTER — Encounter
Admission: RE | Admit: 2019-06-04 | Discharge: 2019-06-04 | Disposition: A | Discharge status: Home / Self Care | Payer: PRIVATE HEALTH INSURANCE | Source: Ambulatory Visit | Attending: Surgery | Admitting: Surgery

## 2019-06-04 NOTE — Patient Instructions (Signed)
Your procedure is scheduled on: 06/10/19 Report to Day Surgery.[facility]LO[facility] To find out your arrival time please call [phone] between 1PM - 3PM on  06/09/19.  Remember: Instructions that are not followed completely may result in serious medical risk,  up to and including death, or upon the discretion of your surgeon and anesthesiologist your  surgery may need to be rescheduled.     _X__ 1. Do not eat food after midnight the night before your procedure.                 No gum chewing or hard candies. You may drink clear liquids up to 2 hours                 before you are scheduled to arrive for your surgery- DO not drink clear                 liquids within 2 hours of the start of your surgery.                 Clear Liquids include:  water, apple juice without pulp, clear carbohydrate                 drink such as Clearfast of Gatorade, Black Coffee or Tea (Do not add                 anything to coffee or tea).  __X__2.  On the morning of surgery brush your teeth with toothpaste and water, you                may rinse your mouth with mouthwash if you wish.  Do not swallow any toothpaste of mouthwash.     _X__ 3.  No Alcohol for 24 hours before or after surgery.   _X__ 4.  Do Not Smoke or use e-cigarettes For 24 Hours Prior to Your Surgery.                 Do not use any chewable tobacco products for at least 6 hours prior to                 surgery.  ____  5.  Bring all medications with you on the day of surgery if instructed.   _X___  6.  Notify your doctor if there is any change in your medical condition      (cold, fever, infections).     Do not wear jewelry, make-up, hairpins, clips or nail polish. Do not wear lotions, powders, or perfumes. You may wear deodorant. Do not shave 48 hours prior to surgery. Men may shave face and neck. Do not bring valuables to the hospital.    [facility] is not responsible for any belongings or  valuables.  Contacts, dentures or bridgework may not be worn into surgery. Leave your suitcase in the car. After surgery it may be brought to your room. For patients admitted to the hospital, discharge time is determined by your treatment team.   Patients discharged the day of surgery will not be allowed to drive home.   Please read over the following fact sheets that you were given:   Surgical Site Infection Prevention          ____ Take these medicines the morning of surgery with A SIP OF WATER:    1. NONE  2.   3.   4.  5.  6.  ____ Fleet Enema (as directed)  _X___ Use CHG Soap as directed  ____ Use inhalers on the day of surgery  ____ Stop metformin 2 days prior to surgery    ____ Take 1/2 of usual insulin dose the night before surgery. No insulin the morning          of surgery.   ____ Stop Coumadin/Plavix/aspirin on   X____ Stop Anti-inflammatories on    STOP MELOXICAM 06/04/19  ____ Stop supplements until after surgery.    ____ Bring C-Pap to the hospital.

## 2019-06-06 ENCOUNTER — Other Ambulatory Visit: Payer: Self-pay

## 2019-06-06 ENCOUNTER — Other Ambulatory Visit
Admission: RE | Admit: 2019-06-06 | Discharge: 2019-06-06 | Disposition: A | Discharge status: Home / Self Care | Payer: PRIVATE HEALTH INSURANCE | Source: Ambulatory Visit | Attending: Surgery | Admitting: Surgery

## 2019-06-06 DIAGNOSIS — Z01812 Encounter for preprocedural laboratory examination: Secondary | ICD-10-CM | POA: Insufficient documentation

## 2019-06-06 DIAGNOSIS — Z1159 Encounter for screening for other viral diseases: Secondary | ICD-10-CM | POA: Insufficient documentation

## 2019-06-07 LAB — NOVEL CORONAVIRUS, NAA (HOSP ORDER, SEND-OUT TO REF LAB; TAT 18-24 HRS): SARS-CoV-2, NAA: NOT DETECTED

## 2019-06-10 ENCOUNTER — Inpatient Hospital Stay
Admission: RE | Admit: 2019-06-10 | Discharge: 2019-06-13 | DRG: 342 | Disposition: A | Discharge status: Home / Self Care | Payer: PRIVATE HEALTH INSURANCE | Attending: Surgery | Admitting: Surgery

## 2019-06-10 ENCOUNTER — Encounter: Payer: Self-pay | Admitting: *Deleted

## 2019-06-10 ENCOUNTER — Other Ambulatory Visit: Payer: Self-pay

## 2019-06-10 ENCOUNTER — Encounter
Admission: RE | Disposition: A | Discharge status: Home / Self Care | Payer: Self-pay | Source: Home / Self Care | Attending: Surgery

## 2019-06-10 ENCOUNTER — Inpatient Hospital Stay: Payer: PRIVATE HEALTH INSURANCE | Admitting: Anesthesiology

## 2019-06-10 DIAGNOSIS — Z9889 Other specified postprocedural states: Secondary | ICD-10-CM

## 2019-06-10 DIAGNOSIS — K432 Incisional hernia without obstruction or gangrene: Principal | ICD-10-CM | POA: Diagnosis present

## 2019-06-10 DIAGNOSIS — Z1159 Encounter for screening for other viral diseases: Secondary | ICD-10-CM | POA: Diagnosis not present

## 2019-06-10 DIAGNOSIS — N179 Acute kidney failure, unspecified: Secondary | ICD-10-CM | POA: Diagnosis present

## 2019-06-10 DIAGNOSIS — K439 Ventral hernia without obstruction or gangrene: Secondary | ICD-10-CM

## 2019-06-10 DIAGNOSIS — R509 Fever, unspecified: Secondary | ICD-10-CM | POA: Diagnosis not present

## 2019-06-10 DIAGNOSIS — Z8719 Personal history of other diseases of the digestive system: Secondary | ICD-10-CM

## 2019-06-10 DIAGNOSIS — K660 Peritoneal adhesions (postprocedural) (postinfection): Secondary | ICD-10-CM | POA: Diagnosis present

## 2019-06-10 DIAGNOSIS — I10 Essential (primary) hypertension: Secondary | ICD-10-CM | POA: Diagnosis present

## 2019-06-10 HISTORY — PX: ABDOMINAL WALL DEFECT REPAIR: SHX53

## 2019-06-10 HISTORY — PX: VENTRAL HERNIA REPAIR: SHX424

## 2019-06-10 HISTORY — PX: APPENDECTOMY: SHX54

## 2019-06-10 LAB — CBC
HCT: 45.2 % (ref 39.0–52.0)
Hemoglobin: 15.1 g/dL (ref 13.0–17.0)
MCH: 31.7 pg (ref 26.0–34.0)
MCHC: 33.4 g/dL (ref 30.0–36.0)
MCV: 95 fL (ref 80.0–100.0)
Platelets: 185 10*3/uL (ref 150–400)
RBC: 4.76 MIL/uL (ref 4.22–5.81)
RDW: 11.9 % (ref 11.5–15.5)
WBC: 9.7 10*3/uL (ref 4.0–10.5)
nRBC: 0 % (ref 0.0–0.2)

## 2019-06-10 LAB — CREATININE, SERUM
Creatinine, Ser: 1.51 mg/dL — ABNORMAL HIGH (ref 0.61–1.24)
GFR calc Af Amer: >60 mL/min (ref >60)
GFR calc non Af Amer: 52 mL/min — ABNORMAL LOW (ref >60)

## 2019-06-10 LAB — POCT I-STAT 4, (NA,K, GLUC, HGB,HCT)
Glucose, Bld: 111 mg/dL — ABNORMAL HIGH (ref 70–99)
HCT: 42 % (ref 39.0–52.0)
Hemoglobin: 14.3 g/dL (ref 13.0–17.0)
Potassium: 3.7 mmol/L (ref 3.5–5.1)
Sodium: 140 mmol/L (ref 135–145)

## 2019-06-10 SURGERY — REPAIR, HERNIA, VENTRAL
Anesthesia: General

## 2019-06-10 MED ORDER — DIPHENHYDRAMINE HCL 12.5 MG/5ML PO ELIX
12.5000 mg | ORAL_SOLUTION | Freq: Four times a day (QID) | ORAL | Status: DC | PRN
  Filled 2019-06-10: qty 5

## 2019-06-10 MED ORDER — MIDAZOLAM HCL 2 MG/2ML IJ SOLN
INTRAMUSCULAR | Status: AC
  Filled 2019-06-10: qty 2

## 2019-06-10 MED ORDER — GABAPENTIN 300 MG PO CAPS
ORAL_CAPSULE | ORAL | Status: AC
  Administered 2019-06-10: 300 mg via ORAL
  Filled 2019-06-10: qty 1

## 2019-06-10 MED ORDER — CYCLOBENZAPRINE HCL 5 MG PO TABS
7.5000 mg | ORAL_TABLET | Freq: Three times a day (TID) | ORAL | Status: DC
  Administered 2019-06-10 – 2019-06-13 (×8): 7.5 mg via ORAL
  Filled 2019-06-10 (×10): qty 1.5

## 2019-06-10 MED ORDER — GABAPENTIN 400 MG PO CAPS
400.0000 mg | ORAL_CAPSULE | Freq: Three times a day (TID) | ORAL | Status: DC
  Administered 2019-06-10 – 2019-06-13 (×8): 400 mg via ORAL
  Filled 2019-06-10 (×8): qty 1

## 2019-06-10 MED ORDER — MIDAZOLAM HCL 2 MG/2ML IJ SOLN
INTRAMUSCULAR | Status: DC | PRN
  Administered 2019-06-10: 2 mg via INTRAVENOUS

## 2019-06-10 MED ORDER — PANTOPRAZOLE SODIUM 40 MG IV SOLR
40.0000 mg | Freq: Every day | INTRAVENOUS | Status: DC
  Administered 2019-06-10 – 2019-06-12 (×3): 40 mg via INTRAVENOUS
  Filled 2019-06-10 (×3): qty 40

## 2019-06-10 MED ORDER — ACETAMINOPHEN 500 MG PO TABS
ORAL_TABLET | ORAL | Status: AC
  Administered 2019-06-10: 08:00:00 1000 mg via ORAL
  Filled 2019-06-10: qty 2

## 2019-06-10 MED ORDER — OXYCODONE HCL 5 MG PO TABS
ORAL_TABLET | ORAL | Status: AC
  Filled 2019-06-10: qty 1

## 2019-06-10 MED ORDER — PROPOFOL 10 MG/ML IV BOLUS
INTRAVENOUS | Status: DC | PRN
  Administered 2019-06-10: 200 mg via INTRAVENOUS

## 2019-06-10 MED ORDER — CHLORHEXIDINE GLUCONATE CLOTH 2 % EX PADS: 6.0000 | MEDICATED_PAD | Freq: Once | CUTANEOUS | Status: DC

## 2019-06-10 MED ORDER — GABAPENTIN 300 MG PO CAPS
300.0000 mg | ORAL_CAPSULE | ORAL | Status: AC
  Administered 2019-06-10: 08:00:00 300 mg via ORAL

## 2019-06-10 MED ORDER — EPHEDRINE SULFATE 50 MG/ML IJ SOLN
INTRAMUSCULAR | Status: DC | PRN
  Administered 2019-06-10: 10 mg via INTRAVENOUS

## 2019-06-10 MED ORDER — LISINOPRIL 20 MG PO TABS
20.0000 mg | ORAL_TABLET | Freq: Every day | ORAL | Status: DC
  Administered 2019-06-10 – 2019-06-11 (×2): 20 mg via ORAL
  Filled 2019-06-10 (×2): qty 1

## 2019-06-10 MED ORDER — LIDOCAINE HCL (PF) 2 % IJ SOLN
INTRAMUSCULAR | Status: AC
  Filled 2019-06-10: qty 10

## 2019-06-10 MED ORDER — DEXAMETHASONE SODIUM PHOSPHATE 10 MG/ML IJ SOLN
INTRAMUSCULAR | Status: DC | PRN
  Administered 2019-06-10: 10 mg via INTRAVENOUS

## 2019-06-10 MED ORDER — ONDANSETRON HCL 4 MG/2ML IJ SOLN
INTRAMUSCULAR | Status: AC
  Filled 2019-06-10: qty 2

## 2019-06-10 MED ORDER — LACTATED RINGERS IV SOLN
INTRAVENOUS | Status: DC
  Administered 2019-06-10 (×2): via INTRAVENOUS

## 2019-06-10 MED ORDER — SODIUM CHLORIDE 0.9 % IV SOLN
INTRAVENOUS | Status: DC | PRN
  Administered 2019-06-10: 70 mL

## 2019-06-10 MED ORDER — EPHEDRINE SULFATE 50 MG/ML IJ SOLN
INTRAMUSCULAR | Status: AC
  Filled 2019-06-10: qty 1

## 2019-06-10 MED ORDER — ONDANSETRON 4 MG PO TBDP: 4.0000 mg | ORAL_TABLET | Freq: Four times a day (QID) | ORAL | Status: DC | PRN

## 2019-06-10 MED ORDER — HEPARIN SODIUM (PORCINE) 5000 UNIT/ML IJ SOLN
INTRAMUSCULAR | Status: AC
  Administered 2019-06-10: 5000 [IU] via SUBCUTANEOUS
  Filled 2019-06-10: qty 1

## 2019-06-10 MED ORDER — ACETAMINOPHEN 500 MG PO TABS
1000.0000 mg | ORAL_TABLET | Freq: Four times a day (QID) | ORAL | Status: DC
  Administered 2019-06-10 – 2019-06-13 (×10): 1000 mg via ORAL
  Filled 2019-06-10 (×12): qty 2

## 2019-06-10 MED ORDER — FAMOTIDINE 20 MG PO TABS
20.0000 mg | ORAL_TABLET | Freq: Once | ORAL | Status: AC
  Administered 2019-06-10: 08:00:00 20 mg via ORAL

## 2019-06-10 MED ORDER — PROCHLORPERAZINE MALEATE 10 MG PO TABS
10.0000 mg | ORAL_TABLET | Freq: Four times a day (QID) | ORAL | Status: DC | PRN
  Filled 2019-06-10: qty 1

## 2019-06-10 MED ORDER — ROCURONIUM BROMIDE 100 MG/10ML IV SOLN: INTRAVENOUS | Status: DC | PRN

## 2019-06-10 MED ORDER — BACLOFEN 10 MG PO TABS
10.0000 mg | ORAL_TABLET | Freq: Three times a day (TID) | ORAL | Status: DC | PRN
  Filled 2019-06-10: qty 1

## 2019-06-10 MED ORDER — OXYCODONE HCL 5 MG PO TABS
5.0000 mg | ORAL_TABLET | ORAL | Status: DC | PRN
  Administered 2019-06-10 – 2019-06-11 (×2): 5 mg via ORAL
  Administered 2019-06-11: 10 mg via ORAL
  Filled 2019-06-10 (×2): qty 2
  Filled 2019-06-10: qty 1

## 2019-06-10 MED ORDER — PROCHLORPERAZINE EDISYLATE 10 MG/2ML IJ SOLN
5.0000 mg | Freq: Four times a day (QID) | INTRAMUSCULAR | Status: DC | PRN
  Filled 2019-06-10: qty 2

## 2019-06-10 MED ORDER — SODIUM CHLORIDE (PF) 0.9 % IJ SOLN
INTRAMUSCULAR | Status: AC
  Filled 2019-06-10: qty 50

## 2019-06-10 MED ORDER — DIPHENHYDRAMINE HCL 50 MG/ML IJ SOLN: 12.5000 mg | Freq: Four times a day (QID) | INTRAMUSCULAR | Status: DC | PRN

## 2019-06-10 MED ORDER — PHENYLEPHRINE HCL (PRESSORS) 10 MG/ML IV SOLN
INTRAVENOUS | Status: DC | PRN
  Administered 2019-06-10 (×2): 100 ug via INTRAVENOUS
  Administered 2019-06-10: 200 ug via INTRAVENOUS

## 2019-06-10 MED ORDER — PROPOFOL 10 MG/ML IV BOLUS
INTRAVENOUS | Status: AC
  Filled 2019-06-10: qty 20

## 2019-06-10 MED ORDER — CELECOXIB 200 MG PO CAPS
200.0000 mg | ORAL_CAPSULE | ORAL | Status: AC
  Administered 2019-06-10: 08:00:00 200 mg via ORAL

## 2019-06-10 MED ORDER — PHENYLEPHRINE HCL (PRESSORS) 10 MG/ML IV SOLN
INTRAVENOUS | Status: AC
  Filled 2019-06-10: qty 1

## 2019-06-10 MED ORDER — HYDROCHLOROTHIAZIDE 25 MG PO TABS
25.0000 mg | ORAL_TABLET | Freq: Every day | ORAL | Status: DC
  Administered 2019-06-10 – 2019-06-11 (×2): 25 mg via ORAL
  Filled 2019-06-10 (×2): qty 1

## 2019-06-10 MED ORDER — BUPIVACAINE-EPINEPHRINE (PF) 0.25% -1:200000 IJ SOLN
INTRAMUSCULAR | Status: DC | PRN
  Administered 2019-06-10: 30 mL via PERINEURAL

## 2019-06-10 MED ORDER — CYCLOBENZAPRINE HCL 5 MG PO TABS
7.5000 mg | ORAL_TABLET | Freq: Three times a day (TID) | ORAL | Status: DC
  Filled 2019-06-10 (×2): qty 1.5

## 2019-06-10 MED ORDER — DEXTROSE IN LACTATED RINGERS 5 % IV SOLN
INTRAVENOUS | Status: DC
  Administered 2019-06-10 – 2019-06-12 (×3): via INTRAVENOUS

## 2019-06-10 MED ORDER — FAMOTIDINE 20 MG PO TABS
ORAL_TABLET | ORAL | Status: AC
  Administered 2019-06-10: 08:00:00 20 mg via ORAL
  Filled 2019-06-10: qty 1

## 2019-06-10 MED ORDER — ROCURONIUM BROMIDE 50 MG/5ML IV SOLN
INTRAVENOUS | Status: AC
  Filled 2019-06-10: qty 1

## 2019-06-10 MED ORDER — ONDANSETRON HCL 4 MG/2ML IJ SOLN: 4.0000 mg | Freq: Four times a day (QID) | INTRAMUSCULAR | Status: DC | PRN

## 2019-06-10 MED ORDER — GABAPENTIN 800 MG PO TABS
400.0000 mg | ORAL_TABLET | Freq: Three times a day (TID) | ORAL | Status: DC
  Filled 2019-06-10 (×2): qty 0.5

## 2019-06-10 MED ORDER — ROCURONIUM BROMIDE 100 MG/10ML IV SOLN
INTRAVENOUS | Status: DC | PRN
  Administered 2019-06-10: 40 mg via INTRAVENOUS
  Administered 2019-06-10 (×7): 20 mg via INTRAVENOUS

## 2019-06-10 MED ORDER — ONDANSETRON HCL 4 MG/2ML IJ SOLN: 4.0000 mg | Freq: Once | INTRAMUSCULAR | Status: DC | PRN

## 2019-06-10 MED ORDER — LIDOCAINE HCL (CARDIAC) PF 100 MG/5ML IV SOSY
PREFILLED_SYRINGE | INTRAVENOUS | Status: DC | PRN
  Administered 2019-06-10: 100 mg via INTRAVENOUS

## 2019-06-10 MED ORDER — CELECOXIB 200 MG PO CAPS
ORAL_CAPSULE | ORAL | Status: AC
  Administered 2019-06-10: 200 mg via ORAL
  Filled 2019-06-10: qty 1

## 2019-06-10 MED ORDER — BUPIVACAINE LIPOSOME 1.3 % IJ SUSP: 20.0000 mL | Freq: Once | INTRAMUSCULAR | Status: DC

## 2019-06-10 MED ORDER — DEXAMETHASONE SODIUM PHOSPHATE 10 MG/ML IJ SOLN
INTRAMUSCULAR | Status: AC
  Filled 2019-06-10: qty 1

## 2019-06-10 MED ORDER — HYDROMORPHONE HCL 1 MG/ML IJ SOLN: 1.0000 mg | INTRAMUSCULAR | Status: DC | PRN

## 2019-06-10 MED ORDER — ACETAMINOPHEN 500 MG PO TABS
1000.0000 mg | ORAL_TABLET | ORAL | Status: AC
  Administered 2019-06-10: 08:00:00 1000 mg via ORAL

## 2019-06-10 MED ORDER — HYDRALAZINE HCL 20 MG/ML IJ SOLN: 10.0000 mg | INTRAMUSCULAR | Status: DC | PRN

## 2019-06-10 MED ORDER — FENTANYL CITRATE (PF) 100 MCG/2ML IJ SOLN
INTRAMUSCULAR | Status: AC
  Filled 2019-06-10: qty 4

## 2019-06-10 MED ORDER — BUPIVACAINE LIPOSOME 1.3 % IJ SUSP
INTRAMUSCULAR | Status: AC
  Filled 2019-06-10: qty 20

## 2019-06-10 MED ORDER — KETOROLAC TROMETHAMINE 30 MG/ML IJ SOLN
30.0000 mg | Freq: Four times a day (QID) | INTRAMUSCULAR | Status: DC
  Administered 2019-06-10 – 2019-06-13 (×6): 30 mg via INTRAVENOUS
  Filled 2019-06-10 (×7): qty 1

## 2019-06-10 MED ORDER — POLYETHYLENE GLYCOL 3350 17 G PO PACK: 17.0000 g | PACK | Freq: Every day | ORAL | Status: DC | PRN

## 2019-06-10 MED ORDER — BUPIVACAINE-EPINEPHRINE (PF) 0.25% -1:200000 IJ SOLN
INTRAMUSCULAR | Status: AC
  Filled 2019-06-10: qty 30

## 2019-06-10 MED ORDER — FENTANYL CITRATE (PF) 100 MCG/2ML IJ SOLN: 25.0000 ug | INTRAMUSCULAR | Status: DC | PRN

## 2019-06-10 MED ORDER — HEPARIN SODIUM (PORCINE) 5000 UNIT/ML IJ SOLN
5000.0000 [IU] | Freq: Once | INTRAMUSCULAR | Status: AC
  Administered 2019-06-10: 08:00:00 5000 [IU] via SUBCUTANEOUS

## 2019-06-10 MED ORDER — LISINOPRIL-HYDROCHLOROTHIAZIDE 20-25 MG PO TABS: 1.0000 | ORAL_TABLET | Freq: Every day | ORAL | Status: DC

## 2019-06-10 MED ORDER — SUGAMMADEX SODIUM 200 MG/2ML IV SOLN
INTRAVENOUS | Status: DC | PRN
  Administered 2019-06-10: 300 mg via INTRAVENOUS

## 2019-06-10 MED ORDER — SODIUM CHLORIDE 0.9 % IV SOLN
1.0000 g | INTRAVENOUS | Status: AC
  Administered 2019-06-10: 1 g via INTRAVENOUS
  Filled 2019-06-10: qty 1

## 2019-06-10 MED ORDER — ENOXAPARIN SODIUM 30 MG/0.3ML ~~LOC~~ SOLN: 30.0000 mg | SUBCUTANEOUS | Status: DC

## 2019-06-10 MED ORDER — FENTANYL CITRATE (PF) 100 MCG/2ML IJ SOLN
INTRAMUSCULAR | Status: DC | PRN
  Administered 2019-06-10 (×4): 50 ug via INTRAVENOUS

## 2019-06-10 SURGICAL SUPPLY — 46 items
APPLIER CLIP 11 MED OPEN (CLIP) ×2
APPLIER CLIP 13 LRG OPEN (CLIP) ×2
BARRIER ADH SEPRAFILM 3INX5IN (MISCELLANEOUS) ×1 IMPLANT
BLADE CLIPPER SURG (BLADE) ×2 IMPLANT
BULB RESERV EVAC DRAIN JP 100C (MISCELLANEOUS) ×2 IMPLANT
CANISTER SUCT 3000ML PPV (MISCELLANEOUS) ×2 IMPLANT
CANISTER WOUND CARE 500ML ATS (WOUND CARE) ×1 IMPLANT
CHLORAPREP W/TINT 26 (MISCELLANEOUS) ×3 IMPLANT
CLIP APPLIE 11 MED OPEN (CLIP) ×1 IMPLANT
CLIP APPLIE 13 LRG OPEN (CLIP) ×1 IMPLANT
COVER WAND RF STERILE (DRAPES) ×2 IMPLANT
DECANTER SPIKE VIAL GLASS SM (MISCELLANEOUS) ×1 IMPLANT
DRAIN CHANNEL 19F RND (DRAIN) ×2 IMPLANT
DRAPE INCISE IOBAN 66X60 STRL (DRAPES) ×1 IMPLANT
DRAPE LAPAROTOMY 100X77 ABD (DRAPES) ×2 IMPLANT
DRSG TELFA 3X8 NADH (GAUZE/BANDAGES/DRESSINGS) ×2 IMPLANT
ELECT CAUTERY BLADE 6.4 (BLADE) ×2 IMPLANT
ELECT REM PT RETURN 9FT ADLT (ELECTROSURGICAL) ×2
ELECTRODE REM PT RTRN 9FT ADLT (ELECTROSURGICAL) ×1 IMPLANT
GAUZE 4X4 16PLY RFD (DISPOSABLE) ×1 IMPLANT
GAUZE SPONGE 4X4 12PLY STRL (GAUZE/BANDAGES/DRESSINGS) ×2 IMPLANT
GLOVE BIO SURGEON STRL SZ7 (GLOVE) ×4 IMPLANT
GOWN STRL REUS W/ TWL LRG LVL3 (GOWN DISPOSABLE) ×2 IMPLANT
GOWN STRL REUS W/TWL LRG LVL3 (GOWN DISPOSABLE) ×3
MESH SOFT 12X12IN BARD (Mesh General) ×1 IMPLANT
NDL HYPO 25X1 1.5 SAFETY (NEEDLE) ×1 IMPLANT
NEEDLE HYPO 22GX1.5 SAFETY (NEEDLE) ×2 IMPLANT
NEEDLE HYPO 25X1 1.5 SAFETY (NEEDLE) ×2 IMPLANT
PACK BASIN MINOR ARMC (MISCELLANEOUS) ×2 IMPLANT
PAD DRESSING TELFA 3X8 NADH (GAUZE/BANDAGES/DRESSINGS) ×1 IMPLANT
PREVENA INCISION MGT 90 150 (MISCELLANEOUS) ×1 IMPLANT
SPONGE KITTNER 5P (MISCELLANEOUS) ×4 IMPLANT
SPONGE LAP 18X18 RF (DISPOSABLE) ×6 IMPLANT
STAPLER PROXIMATE 55 BLUE (STAPLE) ×1 IMPLANT
STAPLER SKIN PROX 35W (STAPLE) ×2 IMPLANT
SUT ETHIBOND 0 MO6 C/R (SUTURE) ×2 IMPLANT
SUT ETHIBOND NAB MO 7 #0 18IN (SUTURE) ×2 IMPLANT
SUT ETHILON 3-0 FS-10 30 BLK (SUTURE) ×6
SUT PDS AB 0 CT1 27 (SUTURE) ×2 IMPLANT
SUT VIC AB 2-0 SH 27 (SUTURE) ×5
SUT VIC AB 2-0 SH 27XBRD (SUTURE) ×2 IMPLANT
SUTURE EHLN 3-0 FS-10 30 BLK (SUTURE) IMPLANT
SYR 20CC LL (SYRINGE) ×3 IMPLANT
TAPE MICROFOAM 4IN (TAPE) ×2 IMPLANT
TRAY FOLEY MTR SLVR 16FR STAT (SET/KITS/TRAYS/PACK) ×1 IMPLANT
WATER STERILE IRR 1000ML POUR (IV SOLUTION) ×2 IMPLANT

## 2019-06-10 NOTE — Anesthesia Procedure Notes (Signed)
Procedure Name: Intubation Date/Time: 06/10/2019 7:26 AM Performed by: Philbert Riser, CRNA Pre-anesthesia Checklist: Patient identified, Emergency Drugs available, Suction available, Patient being monitored and Timeout performed Patient Re-evaluated:Patient Re-evaluated prior to induction Oxygen Delivery Method: Circle system utilized and Simple face mask Preoxygenation: Pre-oxygenation with 100% oxygen Induction Type: IV induction Ventilation: Oral airway inserted - appropriate to patient size and Mask ventilation without difficulty Laryngoscope Size: Mac and 4 Grade View: Grade I Tube type: Oral Number of attempts: 2 Airway Equipment and Method: Stylet Placement Confirmation: ETT inserted through vocal cords under direct vision,  breath sounds checked- equal and bilateral and positive ETCO2 Secured at: 22 cm Tube secured with: Tape Dental Injury: Teeth and Oropharynx as per pre-operative assessment  Difficulty Due To: Difficult Airway- due to anterior larynx and Difficult Airway- due to limited oral opening

## 2019-06-10 NOTE — Progress Notes (Signed)
FMLA papers completed and faxed to [employer] as requested. Leave 06-10-19 to 07-07-19.

## 2019-06-10 NOTE — Anesthesia Post-op Follow-up Note (Signed)
Anesthesia QCDR form completed.        

## 2019-06-10 NOTE — Op Note (Signed)
PROCEDURES: 1. Abdominal wall reconstruction with bilateral myocutaneous flaps using a TAR release and a medium weight macro porus polypropylene mesh measuring 30 x 30 cm,  2. Incisional hernia repair 3. Appendectomy 3. Wound vac Placement 34 x 2 cm ( Prevenal plus)   Pre-operative Diagnosis: recurrent symptomatic ventral ventral hernia  Post-operative Diagnosis: Same   Surgeon: Merri Rayiego F Pabon   Anesthesia: General endotracheal anesthesia  ASA Class: 2  Surgeon: Sterling Bigiego Pabon , MD FACS  Anesthesia: Gen. with endotracheal tube  Assistant: Dr. Aleen CampiPiscoya ( essential due to the complexity of the procedure and to have adequate exposure)  Findings: Large ventral hernia with parastomal hernia with loss of domain Great coverage of defect with reconstruction of  The abd wall   Estimated Blood Loss: 50cc         Drains: 19 FR x 2               Complications: none        Condition: stable  Procedure Details  The patient was seen again in the Holding Room. The benefits, complications, treatment options, and expected outcomes were discussed with the patient. The risks of bleeding, infection, recurrence of symptoms, failure to resolve symptoms,  bowel injury, any of which could require further surgery were reviewed with the patient.   The patient was taken to Operating Room, identified as Jose Wood and the procedure verified.  A Time Out was held and the above information confirmed.  Generous elliptical incision was created from pubis to xiphoid incorporating the old scar.  The subcutaneous tissue is divided and the fascia and the hernia sac are identified. The peritoneal cavity was entered.  Extensive adhesions were performed using a combination of electrocautery and Metzenbaum scissors.  There was significant adhesions of the small bowel to the abdominal wall and the omentum to the abdominal wall.  Once we had good lysis of adhesions we entered the retrorectus space by identifying/ incising  the posterior sheath. This dissection is then performed laterally to the linea semilunaris, the most lateral aspect of the retrorectus space, using a combination of both blunt and sharp dissection   Decided to perform an appendectomy given the extensive surgery.  The mesoappendix was divided between clamps and suture ligated and the appendix was divided with a standard 55 GIA stapler.  The retromuscular space is then bluntly developed to as far as the lateral border of the psoas muscle. The dissection is repeated on the opposite site and  Was carried superiorly to the central tendon of the diaphragm, using a subxiphoid and retrosternal dissection. Dissection also  was developed inferiorly to the retropubic space.   Next, the posterior rectus sheaths are approximated to one another with 0 PDS suture.  Using liposomal Marcaine TAP Block was performed on each side under direct visualization.   I Selected a 30 x 30 cm microcuries polypropylene mesh and in a diamond configuration place it anterior to the peritoneum.  Mesh was Fix it to both the pubic and the side foot with interrupted 0 PDS sutures.  The mesh lay very flat on in a corset fashion.  I was very happy with the repair.  A 19 [place] Blake drain was placed in the retrorectus space in the standard fashion.  This retrorectus drain was placed on the right inferior abdominal wall.  Attention Was turned to the anterior sheath.  This was approximated with multiple running 0 PDS sutures.  We made sure that we released some of the  tension that was attached to the subcutaneous tissue. And the significant that space I decided to also place a 19 Blake drain within the subcutaneous tissue.  The exit of this drain was to the left upper quadrant.  The wound was irrigated with saline and sterile water.  It was approximated with a staples and a Prevena plus wound VAC was placed in the standard fashion.  Needle and laparotomy count were correct and there were no  immediate complications.   [name], MD, FACS

## 2019-06-10 NOTE — Interval H&P Note (Signed)
History and Physical Interval Note:  06/10/2019 7:30 AM  Jose Wood  has presented today for surgery, with the diagnosis of VENTRAL HERNIA K43.9.  The various methods of treatment have been discussed with the patient and family. After consideration of risks, benefits and other options for treatment, the patient has consented to  Procedure(s): HERNIA REPAIR VENTRAL ADULT (N/A) REPAIR ABDOMINAL WALL RECONSTRUCTION (N/A) as a surgical intervention.  The patient's history has been reviewed, patient examined, no change in status, stable for surgery.  I have reviewed the patient's chart and labs.  Questions were answered to the patient's satisfaction.     [place]

## 2019-06-10 NOTE — Transfer of Care (Signed)
Immediate Anesthesia Transfer of Care Note  Patient: Jose Wood  Procedure(s) Performed: HERNIA REPAIR VENTRAL ADULT (N/A ) REPAIR ABDOMINAL WALL RECONSTRUCTION (N/A ) APPENDECTOMY (N/A )  Patient Location: PACU  Anesthesia Type:General  Level of Consciousness: alert   Airway & Oxygen Therapy: Patient Spontanous Breathing and Patient connected to face mask oxygen  Post-op Assessment: Report given to RN and Post -op Vital signs reviewed and stable  Post vital signs: Reviewed and stable  Last Vitals:  Vitals Value Taken Time  BP 128/79 06/10/19 1426  Temp    Pulse 94 06/10/19 1428  Resp 23 06/10/19 1428  SpO2 98 % 06/10/19 1428  Vitals shown include unvalidated device data.  Last Pain:  Vitals:   06/10/19 0729  TempSrc: Oral  PainSc: 2          Complications: No apparent anesthesia complications

## 2019-06-10 NOTE — Anesthesia Preprocedure Evaluation (Signed)
Anesthesia Evaluation  Patient identified by MRN, date of birth, ID band Patient awake    Reviewed: Allergy & Precautions, NPO status , Patient's Chart, lab work & pertinent test results  History of Anesthesia Complications Negative for: history of anesthetic complications  Airway Mallampati: II       Dental   Pulmonary neg sleep apnea, neg COPD,           Cardiovascular hypertension, Pt. on medications (-) Past MI and (-) CHF (-) dysrhythmias (-) Valvular Problems/Murmurs     Neuro/Psych neg Seizures    GI/Hepatic Neg liver ROS, neg GERD  ,  Endo/Other  neg diabetes  Renal/GU negative Renal ROS     Musculoskeletal   Abdominal   Peds  Hematology   Anesthesia Other Findings   Reproductive/Obstetrics                             Anesthesia Physical Anesthesia Plan  ASA: II  Anesthesia Plan:    Post-op Pain Management:    Induction: Intravenous  PONV Risk Score and Plan: 2 and Dexamethasone and Midazolam  Airway Management Planned: Oral ETT  Additional Equipment:   Intra-op Plan:   Post-operative Plan:   Informed Consent: I have reviewed the patients History and Physical, chart, labs and discussed the procedure including the risks, benefits and alternatives for the proposed anesthesia with the patient or authorized representative who has indicated his/her understanding and acceptance.       Plan Discussed with:   Anesthesia Plan Comments:         Anesthesia Quick Evaluation

## 2019-06-11 ENCOUNTER — Encounter: Payer: Self-pay | Admitting: Surgery

## 2019-06-11 LAB — CBC
HCT: 37.1 % — ABNORMAL LOW (ref 39.0–52.0)
Hemoglobin: 12.8 g/dL — ABNORMAL LOW (ref 13.0–17.0)
MCH: 31.7 pg (ref 26.0–34.0)
MCHC: 34.5 g/dL (ref 30.0–36.0)
MCV: 91.8 fL (ref 80.0–100.0)
Platelets: 157 10*3/uL (ref 150–400)
RBC: 4.04 MIL/uL — ABNORMAL LOW (ref 4.22–5.81)
RDW: 12 % (ref 11.5–15.5)
WBC: 9.7 10*3/uL (ref 4.0–10.5)
nRBC: 0 % (ref 0.0–0.2)

## 2019-06-11 LAB — MAGNESIUM: Magnesium: 1.5 mg/dL — ABNORMAL LOW (ref 1.7–2.4)

## 2019-06-11 LAB — BASIC METABOLIC PANEL
Anion gap: 10 (ref 5–15)
BUN: 26 mg/dL — ABNORMAL HIGH (ref 6–20)
CO2: 24 mmol/L (ref 22–32)
Calcium: 8.1 mg/dL — ABNORMAL LOW (ref 8.9–10.3)
Chloride: 100 mmol/L (ref 98–111)
Creatinine, Ser: 1.54 mg/dL — ABNORMAL HIGH (ref 0.61–1.24)
GFR calc Af Amer: 59 mL/min — ABNORMAL LOW (ref >60)
GFR calc non Af Amer: 51 mL/min — ABNORMAL LOW (ref >60)
Glucose, Bld: 141 mg/dL — ABNORMAL HIGH (ref 70–99)
Potassium: 4.2 mmol/L (ref 3.5–5.1)
Sodium: 134 mmol/L — ABNORMAL LOW (ref 135–145)

## 2019-06-11 LAB — HIV ANTIBODY (ROUTINE TESTING W REFLEX): HIV Screen 4th Generation wRfx: NONREACTIVE

## 2019-06-11 LAB — PHOSPHORUS: Phosphorus: 4.4 mg/dL (ref 2.5–4.6)

## 2019-06-11 MED ORDER — ENOXAPARIN SODIUM 40 MG/0.4ML ~~LOC~~ SOLN
40.0000 mg | SUBCUTANEOUS | Status: DC
  Administered 2019-06-11 – 2019-06-13 (×3): 40 mg via SUBCUTANEOUS
  Filled 2019-06-11 (×3): qty 0.4

## 2019-06-11 MED ORDER — MAGNESIUM SULFATE 2 GM/50ML IV SOLN
2.0000 g | Freq: Once | INTRAVENOUS | Status: AC
  Administered 2019-06-11: 2 g via INTRAVENOUS
  Filled 2019-06-11: qty 50

## 2019-06-11 NOTE — Progress Notes (Signed)
Foley catheter removed, patient tolerated well. JP drains emptied and output documented. Patient c/o 4/10 back pain on right side, PRN oxy IR given.   Will attempt to ambulate patient after medicine has time to work.   Educated patient on importance of using urinal now that foley removed to measure UOP.  Will continue to assess and monitor.

## 2019-06-11 NOTE — Progress Notes (Signed)
[place] Hospital Day(s): 1.   Post op day(s): 1 Day Post-Op.   Interval History: Patient seen and examined, no acute events or new complaints overnight. Patient reports some mild generalized abdominal discomfort although this is well controlled with pain medications. No fever, chills, nausea, or emesis. He has been tolerating a diet post op. He did have some hypomagnesemia this morning but otherwise no issues. JP in right abdomen with 160 ccs out and JP in Left abdomen with 20 ccs out.    Vital signs in last 24 hours: [min-max] current  Temp:  [97.5 F (36.4 C)-98.4 F (36.9 C)] 98.4 F (36.9 C) (06/17 0503) Pulse Rate:  [79-94] 84 (06/17 0503) Resp:  [12-23] 20 (06/17 0503) BP: (114-137)/(74-88) 114/74 (06/17 0503) SpO2:  [95 %-98 %] 96 % (06/17 0503)     Height: 6\' 2"  (188 cm) Weight: 118.4 kg BMI (Calculated): 33.5   Intake/Output last 2 shifts:  06/16 0701 - 06/17 0700 In: 3961.3 [P.O.:240; I.V.:3721.3] Out: 1515 [Urine:1235; Drains:180; Blood:100]    Physical Exam:  Constitutional: alert, cooperative and no distress  Respiratory: breathing non-labored at rest  Gastrointestinal: soft, non-tender, and non-distended. Non-distended. No rebound/guarding. Midline incision with wound vac in place with good seal, no leak. JP drains in Left abdomen and RLQ with serosanguinous drainage.     Labs:  CBC Latest Ref Rng & Units 06/11/2019 06/10/2019 06/10/2019  WBC 4.0 - 10.5 K/uL 9.7 9.7 -  Hemoglobin 13.0 - 17.0 g/dL 12.8(L) 15.1 14.3  Hematocrit 39.0 - 52.0 % 37.1(L) 45.2 42.0  Platelets 150 - 400 K/uL 157 185 -   CMP Latest Ref Rng & Units 06/11/2019 06/10/2019 02/26/2019  Glucose 70 - 99 mg/dL 141(H) 111(H) 99  BUN 6 - 20 mg/dL 26(H) - 19  Creatinine 0.61 - 1.24 mg/dL 1.54(H) 1.51(H) 1.21  Sodium 135 - 145 mmol/L 134(L) 140 138  Potassium 3.5 - 5.1 mmol/L 4.2 3.7 4.3  Chloride 98 - 111 mmol/L 100 - 101  CO2 22 - 32 mmol/L 24 - 27   Calcium 8.9 - 10.3 mg/dL 8.1(L) - 9.3  Total Protein 6.5 - 8.1 g/dL - - 8.3(H)  Total Bilirubin 0.3 - 1.2 mg/dL - - 0.8  Alkaline Phos 38 - 126 U/L - - 35(L)  AST 15 - 41 U/L - - 25  ALT 0 - 44 U/L - - 19     Imaging studies: No new pertinent imaging studies   Assessment/Plan:  53 y.o. male with mild hypomagnesemia and expected post-surgical soreness but otherwise overall doing well 1 Day Post-Op s/p abdominal wall reconsturction   - Continue regular diet as tolerates  - Continue IVF today given mild AKI on admission  - Pain control prn; antiemetics prn  - Monitor abdominal examination; on-going bowel function   - Discontinue foley catheter  - Continue wound vac + JP drain; monitor output  - Replete magnesium; re-check tomorrow  - mobilization encouraged  - medical management of comorbidities  - DVT prophlyaxis  All of the above findings and recommendations were discussed with the patient, and the medical team, and all of patient's questions were answered to his expressed satisfaction.  -- Edison Simon, PA-C [facility] 06/11/2019, 9:00 AM [phone] M-F: 7am - 4pm

## 2019-06-12 LAB — BASIC METABOLIC PANEL
Anion gap: 8 (ref 5–15)
BUN: 21 mg/dL — ABNORMAL HIGH (ref 6–20)
CO2: 26 mmol/L (ref 22–32)
Calcium: 8.2 mg/dL — ABNORMAL LOW (ref 8.9–10.3)
Chloride: 102 mmol/L (ref 98–111)
Creatinine, Ser: 1.46 mg/dL — ABNORMAL HIGH (ref 0.61–1.24)
GFR calc Af Amer: >60 mL/min (ref >60)
GFR calc non Af Amer: 55 mL/min — ABNORMAL LOW (ref >60)
Glucose, Bld: 129 mg/dL — ABNORMAL HIGH (ref 70–99)
Potassium: 3.7 mmol/L (ref 3.5–5.1)
Sodium: 136 mmol/L (ref 135–145)

## 2019-06-12 LAB — MAGNESIUM: Magnesium: 2.1 mg/dL (ref 1.7–2.4)

## 2019-06-12 LAB — SURGICAL PATHOLOGY

## 2019-06-12 NOTE — Plan of Care (Signed)

## 2019-06-12 NOTE — Progress Notes (Signed)
[place] Hospital Day(s): 2.   Post op day(s): 2 Days Post-Op.   Interval History: Patient seen and examined, he did spike a fever to 102F last night but otherwise no issues overnight. Patient reports that he continues to have some abdominal soreness but can get comfort from laying on his right side. No reports of cough, congestion, SOB, nausea, or emesis. He is tolerating a diet without issue. He mobilized yesterday with plans to move around more today. Still with slight AKI but this is improved and has significant UO. Left JP drain with 60 ccs out and right JP with 90 ccs out.    Vital signs in last 24 hours: [min-max] current  Temp:  [98.2 F (36.8 C)-102.2 F (39 C)] 98.9 F (37.2 C) (06/18 0523) Pulse Rate:  [96-105] 96 (06/18 0523) Resp:  [16-20] 16 (06/18 0523) BP: (98-119)/(54-78) 115/74 (06/18 0523) SpO2:  [96 %-100 %] 100 % (06/18 0523)     Height: 6\' 2"  (188 cm) Weight: 118.4 kg BMI (Calculated): 33.5   Intake/Output last 2 shifts:  06/17 0701 - 06/18 0700 In: 3206.4 [P.O.:1630; I.V.:1576.4] Out: 3230 [Urine:3075; Drains:155]   Physical Exam:  Constitutional: alert, cooperative and no distress  Respiratory: breathing non-labored at rest  Gastrointestinal: soft, non-tender, and non-distended. Non-distended. No rebound/guarding. Midline incision with wound vac in place with good seal, no leak. JP drains in Left abdomen and RLQ with serosanguinous drainage.    Labs:  CBC Latest Ref Rng & Units 06/11/2019 06/10/2019 06/10/2019  WBC 4.0 - 10.5 K/uL 9.7 9.7 -  Hemoglobin 13.0 - 17.0 g/dL 12.8(L) 15.1 14.3  Hematocrit 39.0 - 52.0 % 37.1(L) 45.2 42.0  Platelets 150 - 400 K/uL 157 185 -   CMP Latest Ref Rng & Units 06/12/2019 06/11/2019 06/10/2019  Glucose 70 - 99 mg/dL 129(H) 141(H) 111(H)  BUN 6 - 20 mg/dL 21(H) 26(H) -  Creatinine 0.61 - 1.24 mg/dL 1.46(H) 1.54(H) 1.51(H)  Sodium 135 - 145 mmol/L 136 134(L) 140  Potassium 3.5 - 5.1  mmol/L 3.7 4.2 3.7  Chloride 98 - 111 mmol/L 102 100 -  CO2 22 - 32 mmol/L 26 24 -  Calcium 8.9 - 10.3 mg/dL 8.2(L) 8.1(L) -  Total Protein 6.5 - 8.1 g/dL - - -  Total Bilirubin 0.3 - 1.2 mg/dL - - -  Alkaline Phos 38 - 126 U/L - - -  AST 15 - 41 U/L - - -  ALT 0 - 44 U/L - - -    Imaging studies: No new pertinent imaging studies   Assessment/Plan: 53 y.o. male with mild AKI although making good urine (may be new baseline) otherwise overall doing well 2 Days Post-Op s/p abdominal wall reconstruction for significant incisional hernia   - Continue regular diet as tolerates             - Discontinue IVF             - Pain control prn; antiemetics prn             - Monitor abdominal examination; on-going bowel function              - Continue wound vac + JP drain; monitor output; will place Prevena today             - mobilization encouraged             - medical management of comorbidities             -  DVT prophlyaxis  All of the above findings and recommendations were discussed with the patient, and the medical team, and all of patient's questions were answered to his expressed satisfaction.  -- Lynden OxfordZachary Schulz, PA-C [facility] 06/12/2019, 7:35 AM [id number] M-F: 7am - 4pm

## 2019-06-12 NOTE — Anesthesia Postprocedure Evaluation (Signed)
Anesthesia Post Note  Patient: Jose Wood  Procedure(s) Performed: HERNIA REPAIR VENTRAL ADULT (N/A ) REPAIR ABDOMINAL WALL RECONSTRUCTION (N/A ) APPENDECTOMY (N/A )  Patient location during evaluation: PACU Anesthesia Type: General Level of consciousness: awake and alert Pain management: pain level controlled Vital Signs Assessment: post-procedure vital signs reviewed and stable Respiratory status: spontaneous breathing, nonlabored ventilation and respiratory function stable Cardiovascular status: blood pressure returned to baseline and stable Postop Assessment: no apparent nausea or vomiting Anesthetic complications: no     Last Vitals:  Vitals:   06/11/19 2000 06/12/19 0523  BP:  115/74  Pulse:  96  Resp:  16  Temp: 36.8 C 37.2 C  SpO2:  100%    Last Pain:  Vitals:   06/12/19 0523  TempSrc: Oral  PainSc:                  Alphonsus Sias

## 2019-06-13 MED ORDER — POLYETHYLENE GLYCOL 3350 17 G PO PACK: 17.0000 g | PACK | Freq: Every day | ORAL | 0 refills | Status: AC | PRN

## 2019-06-13 MED ORDER — OXYCODONE HCL 5 MG PO TABS: 5.0000 mg | ORAL_TABLET | Freq: Four times a day (QID) | ORAL | 0 refills | Status: DC | PRN

## 2019-06-13 MED ORDER — DOCUSATE SODIUM 100 MG PO CAPS: 100.0000 mg | ORAL_CAPSULE | Freq: Every day | ORAL | 0 refills | Status: AC

## 2019-06-13 MED ORDER — DOCUSATE SODIUM 100 MG PO CAPS: 100.0000 mg | ORAL_CAPSULE | Freq: Every day | ORAL | Status: DC

## 2019-06-13 NOTE — Discharge Summary (Addendum)
[facility] SURGICAL DISCHARGE SUMMARY  Patient ID: Jose Wood MRN: [id number] DOB/AGE: 53-Jul-1967 53 y.o.  Admit date: 06/10/2019 Discharge date: 06/13/2019  Discharge Diagnoses Patient Active Problem List   Diagnosis Date Noted  . S/P repair of recurrent ventral hernia 06/10/2019  . Incisional hernia, without obstruction or gangrene 10/15/2018   Consultants None  Procedures 06/10/2019:  1. Abdominal wall reconstruction with bilateral myocutaneous flaps using a TAR release and a medium weight macro porus polypropylene mesh measuring 30 x 30 cm 2. Incisional hernia repair 3. Appendectomy 3. Wound vac Placement 34 x 2 cm ( Prevenal plus)  HPI: Jose Wood is a 53 y.o. male with a history of ventral hernia who presented to [facility] on 06/16 for scheduled abdominal wall reconstruction with Dr Everlene FarrierPabon.  Hospital Course: Informed consent was obtained and documented, and patient underwent uneventful abdominal wall reconstruction (Dr Everlene FarrierPabon, 06/10/2019).  Post-operatively, patient's pain improved/resolved and advancement of patient's diet and ambulation were well-tolerated. He did have a fever on POD2, which was a singular event and likely related to immobility. The remainder of patient's hospital course was essentially unremarkable, and discharge planning was initiated accordingly with patient safely able to be discharged home with appropriate discharge instructions, including wound vac and drain management, pain control, and outpatient follow-up after all of his questions were answered to his expressed satisfaction.  Discharge Condition: Good   Physical Examination:  Constitutional: Well appearing male, NAD Pulmonary: Normal effort, no respiratory distress Gastrointestinal: soft, non-tender, and non-distended. Non-distended. No rebound/guarding. Midline incision with wound vac in place with good seal, no leak. JP drains in Left abdomen and RLQ with serosanguinous  drainage.   Allergies as of 06/13/2019   No Known Allergies     Medication List    TAKE these medications   baclofen 10 MG tablet Commonly known as: LIORESAL Take 10 mg by mouth 3 (three) times daily as needed for muscle spasms.   docusate sodium 100 MG capsule Commonly known as: COLACE Take 1 capsule (100 mg total) by mouth daily.   lisinopril-hydrochlorothiazide 20-25 MG tablet Commonly known as: ZESTORETIC Take 1 tablet by mouth daily.   meloxicam 15 MG tablet Commonly known as: MOBIC Take 15 mg by mouth daily.   MENS MULTIVITAMIN PLUS PO Take 1 tablet by mouth daily.   oxyCODONE 5 MG immediate release tablet Commonly known as: Oxy IR/ROXICODONE Take 1-2 tablets (5-10 mg total) by mouth every 6 (six) hours as needed for severe pain or breakthrough pain.   polyethylene glycol 17 g packet Commonly known as: MIRALAX / GLYCOLAX Take 17 g by mouth daily as needed for mild constipation.   sildenafil 20 MG tablet Commonly known as: REVATIO Take 20 mg by mouth 3 (three) times daily as needed.        Follow-up Information    Pabon, HawaiiDiego F, MD. Schedule an appointment as soon as possible for a visit on 06/18/2019.   Specialty: General Surgery Why: s/p abdominal wall reconstruction Contact information: [address] [place] [address] [phone]            Time spent on discharge management including discussion of hospital course, clinical condition, outpatient instructions, prescriptions, and follow up with the patient and members of the medical team: >30 minutes  -- Lynden OxfordZachary Schulz , PA-C [facility]  06/13/2019, 9:16 AM [phone] M-F: 7am - 4pm  I saw and evaluated the patient.  I agree with the above documentation, exam, and plan, which I  have edited where appropriate. Fredirick Maudlin  11:28 AM

## 2019-06-13 NOTE — Progress Notes (Signed)
Provided patient with JP drain education and with Prevena wound vac education. Patient verbalized and demonstrated his understanding without any difficulties or additional questions.

## 2019-06-13 NOTE — Discharge Instructions (Signed)
In addition to included general post-operative instructions for abdominal wall reconstruction, ventral hernia repair,  Diet: Resume home diet.   Activity: No heavy lifting >20 pounds (children, pets, laundry, garbage) for at least 4 weeks or until cleared by surgeon  Wound care: Continue wound vac and drain management at home.   Constipation: At home you can take Colace and/or Miralax daily to help with constipation especially while taking narcotic pain medications.   Medications: Resume all home medications. For mild to moderate pain: acetaminophen (Tylenol) or ibuprofen/naproxen (if no kidney disease). Combining Tylenol with alcohol can substantially increase your risk of causing liver disease. Narcotic pain medications, if prescribed, can be used for severe pain, though may cause nausea, constipation, and drowsiness. Do not combine Tylenol and Percocet (or similar) within a 6 hour period as Percocet (and similar) contain(s) Tylenol. If you do not need the narcotic pain medication, you do not need to fill the prescription.  Call office [phone] / [phone]) at any time if any questions, worsening pain, fevers/chills, bleeding, drainage from incision site, or other concerns.

## 2019-06-18 ENCOUNTER — Other Ambulatory Visit: Payer: Self-pay

## 2019-06-18 ENCOUNTER — Encounter: Payer: Self-pay | Admitting: Surgery

## 2019-06-18 ENCOUNTER — Ambulatory Visit (INDEPENDENT_AMBULATORY_CARE_PROVIDER_SITE_OTHER): Payer: PRIVATE HEALTH INSURANCE | Admitting: Surgery

## 2019-06-18 VITALS — BP 122/82 | HR 79 | Temp 97.5°F | Ht 74.0 in | Wt 259.8 lb

## 2019-06-18 DIAGNOSIS — Z09 Encounter for follow-up examination after completed treatment for conditions other than malignant neoplasm: Secondary | ICD-10-CM

## 2019-06-18 NOTE — Progress Notes (Signed)
S/p abd wall reconstruction TAR and mesh Doing great Less 30cc from the drains No fevers or chills Taking Po Pain is tolerable  PE NAD Abd: prevena removed, staples removed and Steris placed. No infection or seroma. Drains removed ( serous) . No recurrence  A/P Doing very well RTC 2 weeks for final wound check

## 2019-06-18 NOTE — Patient Instructions (Addendum)
Follow up here in 2 weeks.   The steri strips will come off on their own in about 1to 2 weeks. You may shower  Please call and ask to speak with a nurse if you develop questions or concerns.

## 2019-07-02 ENCOUNTER — Ambulatory Visit (INDEPENDENT_AMBULATORY_CARE_PROVIDER_SITE_OTHER): Payer: Self-pay | Admitting: Surgery

## 2019-07-02 ENCOUNTER — Encounter: Payer: Self-pay | Admitting: Surgery

## 2019-07-02 ENCOUNTER — Other Ambulatory Visit: Payer: Self-pay

## 2019-07-02 VITALS — BP 121/86 | HR 80 | Temp 97.2°F | Resp 14 | Wt 252.0 lb

## 2019-07-02 DIAGNOSIS — Z09 Encounter for follow-up examination after completed treatment for conditions other than malignant neoplasm: Secondary | ICD-10-CM

## 2019-07-02 NOTE — Progress Notes (Signed)
S/p abd wall reconstruction Doing great considering the extensive surgery that he had. Taking p.o.  Very minimal occasional pains.  No fevers or chills  PE NAD Abd: soft, nt, incision healed, no infection or recurrence.   A/P doing great after abdominal wall reconstruction with mesh.  Patient is extremely happy and very appreciative.  Return to clinic prn. Extend work excuse for 1 week

## 2019-07-02 NOTE — Patient Instructions (Signed)
   Follow-up with our office as needed.  Please call and ask to speak with a nurse if you develop questions or concerns.  

## 2019-07-14 ENCOUNTER — Telehealth: Payer: Self-pay | Admitting: *Deleted

## 2019-07-14 NOTE — Telephone Encounter (Signed)
Patient called and needs a back to work note with no restrictions.

## 2019-07-14 NOTE — Telephone Encounter (Signed)
Returning to work on 07/21/19 with no restrictions.  Fax to [phone] Attention to [employer]

## 2020-06-27 SURGERY — REPAIR, HERNIA, VENTRAL
Anesthesia: General

## 2021-04-05 ENCOUNTER — Emergency Department
Admission: EM | Admit: 2021-04-05 | Discharge: 2021-04-05 | Disposition: A | Discharge status: Home / Self Care | Payer: No Typology Code available for payment source | Attending: Emergency Medicine | Admitting: Emergency Medicine

## 2021-04-05 ENCOUNTER — Other Ambulatory Visit: Payer: Self-pay

## 2021-04-05 ENCOUNTER — Emergency Department: Payer: No Typology Code available for payment source

## 2021-04-05 ENCOUNTER — Encounter: Payer: Self-pay | Admitting: *Deleted

## 2021-04-05 DIAGNOSIS — M79642 Pain in left hand: Secondary | ICD-10-CM | POA: Diagnosis not present

## 2021-04-05 DIAGNOSIS — I10 Essential (primary) hypertension: Secondary | ICD-10-CM | POA: Diagnosis not present

## 2021-04-05 DIAGNOSIS — M79641 Pain in right hand: Secondary | ICD-10-CM | POA: Diagnosis not present

## 2021-04-05 DIAGNOSIS — M79671 Pain in right foot: Secondary | ICD-10-CM | POA: Diagnosis not present

## 2021-04-05 DIAGNOSIS — Z79899 Other long term (current) drug therapy: Secondary | ICD-10-CM | POA: Diagnosis not present

## 2021-04-05 DIAGNOSIS — S39012A Strain of muscle, fascia and tendon of lower back, initial encounter: Secondary | ICD-10-CM | POA: Insufficient documentation

## 2021-04-05 DIAGNOSIS — S3992XA Unspecified injury of lower back, initial encounter: Secondary | ICD-10-CM | POA: Diagnosis present

## 2021-04-05 DIAGNOSIS — M25532 Pain in left wrist: Secondary | ICD-10-CM | POA: Insufficient documentation
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
## 2022-01-16 DIAGNOSIS — I10 Essential (primary) hypertension: Secondary | ICD-10-CM | POA: Diagnosis not present

## 2022-01-16 DIAGNOSIS — R7303 Prediabetes: Secondary | ICD-10-CM | POA: Diagnosis not present

## 2022-01-16 DIAGNOSIS — M5489 Other dorsalgia: Secondary | ICD-10-CM | POA: Diagnosis not present

## 2022-01-16 DIAGNOSIS — Z125 Encounter for screening for malignant neoplasm of prostate: Secondary | ICD-10-CM | POA: Diagnosis not present

## 2022-04-11 DIAGNOSIS — Z79899 Other long term (current) drug therapy: Secondary | ICD-10-CM | POA: Diagnosis not present

## 2022-04-11 DIAGNOSIS — L400 Psoriasis vulgaris: Secondary | ICD-10-CM | POA: Diagnosis not present

## 2022-10-05 DIAGNOSIS — Z125 Encounter for screening for malignant neoplasm of prostate: Secondary | ICD-10-CM | POA: Diagnosis not present

## 2022-10-05 DIAGNOSIS — Z23 Encounter for immunization: Secondary | ICD-10-CM | POA: Diagnosis not present

## 2022-10-05 DIAGNOSIS — I10 Essential (primary) hypertension: Secondary | ICD-10-CM | POA: Diagnosis not present

## 2022-10-05 DIAGNOSIS — Z79899 Other long term (current) drug therapy: Secondary | ICD-10-CM | POA: Diagnosis not present

## 2022-10-05 DIAGNOSIS — R7303 Prediabetes: Secondary | ICD-10-CM | POA: Diagnosis not present

## 2022-10-11 DIAGNOSIS — Z79899 Other long term (current) drug therapy: Secondary | ICD-10-CM | POA: Diagnosis not present

## 2022-10-11 DIAGNOSIS — L853 Xerosis cutis: Secondary | ICD-10-CM | POA: Diagnosis not present

## 2022-10-11 DIAGNOSIS — L400 Psoriasis vulgaris: Secondary | ICD-10-CM | POA: Diagnosis not present

## 2022-12-22 DIAGNOSIS — M79641 Pain in right hand: Secondary | ICD-10-CM | POA: Diagnosis not present

## 2022-12-22 DIAGNOSIS — M4316 Spondylolisthesis, lumbar region: Secondary | ICD-10-CM | POA: Diagnosis not present

## 2022-12-22 DIAGNOSIS — M79605 Pain in left leg: Secondary | ICD-10-CM | POA: Diagnosis not present

## 2022-12-22 DIAGNOSIS — M79604 Pain in right leg: Secondary | ICD-10-CM | POA: Diagnosis not present

## 2022-12-22 DIAGNOSIS — M47816 Spondylosis without myelopathy or radiculopathy, lumbar region: Secondary | ICD-10-CM | POA: Diagnosis not present

## 2022-12-22 DIAGNOSIS — M545 Low back pain, unspecified: Secondary | ICD-10-CM | POA: Diagnosis not present

## 2022-12-22 DIAGNOSIS — L409 Psoriasis, unspecified: Secondary | ICD-10-CM | POA: Diagnosis not present

## 2022-12-22 DIAGNOSIS — M79642 Pain in left hand: Secondary | ICD-10-CM | POA: Diagnosis not present

## 2023-01-17 DIAGNOSIS — I10 Essential (primary) hypertension: Secondary | ICD-10-CM | POA: Diagnosis not present

## 2023-01-17 DIAGNOSIS — R0789 Other chest pain: Secondary | ICD-10-CM | POA: Diagnosis not present

## 2023-01-17 DIAGNOSIS — R7303 Prediabetes: Secondary | ICD-10-CM | POA: Diagnosis not present

## 2023-01-17 DIAGNOSIS — Z0000 Encounter for general adult medical examination without abnormal findings: Secondary | ICD-10-CM | POA: Diagnosis not present

## 2023-02-08 DIAGNOSIS — M5440 Lumbago with sciatica, unspecified side: Secondary | ICD-10-CM | POA: Diagnosis not present

## 2023-02-08 DIAGNOSIS — R0789 Other chest pain: Secondary | ICD-10-CM | POA: Diagnosis not present

## 2023-10-01 ENCOUNTER — Other Ambulatory Visit: Payer: Self-pay | Admitting: Family Medicine

## 2023-10-01 ENCOUNTER — Encounter: Payer: Self-pay | Admitting: Family Medicine

## 2023-10-01 DIAGNOSIS — M5416 Radiculopathy, lumbar region: Secondary | ICD-10-CM

## 2023-10-01 DIAGNOSIS — M5136 Other intervertebral disc degeneration, lumbar region with discogenic back pain only: Secondary | ICD-10-CM

## 2023-10-01 DIAGNOSIS — M5030 Other cervical disc degeneration, unspecified cervical region: Secondary | ICD-10-CM

## 2023-10-02 ENCOUNTER — Ambulatory Visit
Admission: RE | Admit: 2023-10-02 | Discharge: 2023-10-02 | Disposition: A | Discharge status: Home / Self Care | Payer: 59 | Source: Ambulatory Visit | Attending: Family Medicine | Admitting: Family Medicine

## 2023-10-02 ENCOUNTER — Encounter: Payer: Self-pay | Admitting: Family Medicine

## 2023-10-02 DIAGNOSIS — M5030 Other cervical disc degeneration, unspecified cervical region: Secondary | ICD-10-CM

## 2023-10-02 DIAGNOSIS — M5136 Other intervertebral disc degeneration, lumbar region with discogenic back pain only: Secondary | ICD-10-CM

## 2023-10-02 DIAGNOSIS — M5416 Radiculopathy, lumbar region: Secondary | ICD-10-CM

## 2023-11-23 ENCOUNTER — Emergency Department: Payer: 59

## 2023-11-23 ENCOUNTER — Emergency Department
Admission: EM | Admit: 2023-11-23 | Discharge: 2023-11-23 | Disposition: A | Discharge status: Home / Self Care | Payer: 59 | Attending: Emergency Medicine | Admitting: Emergency Medicine

## 2023-11-23 ENCOUNTER — Other Ambulatory Visit: Payer: Self-pay

## 2023-11-23 DIAGNOSIS — M79662 Pain in left lower leg: Secondary | ICD-10-CM

## 2023-11-23 DIAGNOSIS — I10 Essential (primary) hypertension: Secondary | ICD-10-CM | POA: Insufficient documentation

## 2023-11-23 DIAGNOSIS — M79605 Pain in left leg: Secondary | ICD-10-CM | POA: Diagnosis present

## 2023-11-23 NOTE — Discharge Instructions (Signed)
Fortunately your ultrasound did not show any evidence of blood clot in your lower leg.  As we discussed, sometimes smaller blood clots or blood clots that are further down the leg cannot be detected with her ultrasound so it is important that you keep an eye on your symptoms and if your pain worsens, or moves of the leg, call your doctor for repeat ultrasound or come back to the emergency department.  Take acetaminophen 650 mg and ibuprofen 400 mg every 6 hours for pain.  Take with food.   Thank you for choosing [place] for your health care today!  Please see your primary doctor this week for a follow up appointment.   If you have any new, worsening, or unexpected symptoms call your doctor right away or come back to the emergency department for reevaluation.  It was my pleasure to care for you today.   Daneil Dan Modesto Charon, MD

## 2023-11-23 NOTE — ED Triage Notes (Signed)
Monday had a headache that he couldn't get rid of with dizziness when he would change position, pt states he has been having pain in his left calf, that is intermittent for the past couple weeks without injury, pt also mentions a "knot" to the back of his head that has been there for a few months and is uncertain if it is related to the headache he had monday

## 2023-11-23 NOTE — ED Triage Notes (Signed)
Pt here from [facility] with a knot on his left calf. Pt had dizziness with headaches 2 weeks ago with a a bp of 137/101.
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
## 2024-10-06 ENCOUNTER — Other Ambulatory Visit: Payer: Self-pay

## 2024-10-07 ENCOUNTER — Other Ambulatory Visit: Payer: Self-pay

## 2024-10-07 MED ORDER — FOLIC ACID 1 MG PO TABS
1.0000 mg | ORAL_TABLET | Freq: Every day | ORAL | 3 refills | Status: AC
  Filled 2024-10-07: qty 90, 90d supply, fill #0

## 2024-10-07 MED ORDER — SILDENAFIL CITRATE 20 MG PO TABS
20.0000 mg | ORAL_TABLET | Freq: Every day | ORAL | 5 refills | Status: AC | PRN
  Filled 2024-10-07: qty 30, 30d supply, fill #0

## 2024-10-11 DIAGNOSIS — H52223 Regular astigmatism, bilateral: Secondary | ICD-10-CM | POA: Diagnosis not present

## 2024-10-11 DIAGNOSIS — H524 Presbyopia: Secondary | ICD-10-CM | POA: Diagnosis not present

## 2024-10-11 DIAGNOSIS — H5203 Hypermetropia, bilateral: Secondary | ICD-10-CM | POA: Diagnosis not present

## 2024-10-16 ENCOUNTER — Other Ambulatory Visit: Payer: Self-pay

## 2024-10-18 ENCOUNTER — Other Ambulatory Visit: Payer: Self-pay

## 2024-10-18 MED ORDER — LISINOPRIL-HYDROCHLOROTHIAZIDE 20-25 MG PO TABS
1.0000 | ORAL_TABLET | Freq: Every day | ORAL | 0 refills | Status: DC
  Filled 2024-10-22: qty 90, 90d supply, fill #0

## 2024-10-22 ENCOUNTER — Other Ambulatory Visit: Payer: Self-pay

## 2024-10-24 ENCOUNTER — Other Ambulatory Visit: Payer: Self-pay

## 2024-10-27 ENCOUNTER — Other Ambulatory Visit: Payer: Self-pay

## 2024-10-27 MED ORDER — MELOXICAM 15 MG PO TABS
15.0000 mg | ORAL_TABLET | Freq: Every day | ORAL | 2 refills | Status: AC
  Filled 2024-10-27: qty 30, 30d supply, fill #0
  Filled 2024-12-07: qty 30, 30d supply, fill #1
  Filled 2025-01-18: qty 30, 30d supply, fill #2

## 2024-10-27 MED ORDER — SILDENAFIL CITRATE 20 MG PO TABS
20.0000 mg | ORAL_TABLET | Freq: Every day | ORAL | 5 refills | Status: AC | PRN
  Filled 2024-10-27: qty 30, 30d supply, fill #0
  Filled 2025-01-18: qty 30, 30d supply, fill #1

## 2024-10-28 ENCOUNTER — Other Ambulatory Visit: Payer: Self-pay

## 2024-10-28 ENCOUNTER — Encounter (HOSPITAL_COMMUNITY): Payer: Self-pay

## 2024-10-28 ENCOUNTER — Telehealth: Payer: Self-pay

## 2024-10-28 ENCOUNTER — Other Ambulatory Visit (HOSPITAL_COMMUNITY): Payer: Self-pay

## 2024-10-28 MED ORDER — SKYRIZI 150 MG/ML ~~LOC~~ SOSY: PREFILLED_SYRINGE | SUBCUTANEOUS | 2 refills | Status: DC

## 2024-10-28 NOTE — Progress Notes (Signed)
 Pharmacy Patient Advocate Encounter  Insurance verification completed.   The patient is insured through [facility]   Ran test claim for Skyrizi . PA required.   This test claim was processed through [facility]- copay amounts may vary at other pharmacies due to pharmacy/plan contracts, or as the patient moves through the different stages of their insurance plan.

## 2024-10-28 NOTE — Telephone Encounter (Signed)
 Pharmacy Patient Advocate Encounter   Received notification from Patient Pharmacy that prior authorization for Nelma is required/requested.   Insurance verification completed.   The patient is insured through [facility].   Per test claim: PA required; PA submitted to above mentioned insurance via Latent Key/confirmation #/EOC [id number] Status is pending

## 2024-10-29 ENCOUNTER — Other Ambulatory Visit (HOSPITAL_COMMUNITY): Payer: Self-pay

## 2024-10-29 ENCOUNTER — Encounter (HOSPITAL_COMMUNITY): Payer: Self-pay

## 2024-10-29 ENCOUNTER — Ambulatory Visit: Payer: Self-pay | Attending: Internal Medicine | Admitting: Pharmacist

## 2024-10-29 ENCOUNTER — Other Ambulatory Visit: Payer: Self-pay

## 2024-10-29 ENCOUNTER — Other Ambulatory Visit: Payer: Self-pay | Admitting: Pharmacist

## 2024-10-29 ENCOUNTER — Encounter: Payer: Self-pay | Admitting: Pharmacist

## 2024-10-29 DIAGNOSIS — Z7189 Other specified counseling: Secondary | ICD-10-CM

## 2024-10-29 MED ORDER — SKYRIZI 150 MG/ML ~~LOC~~ SOSY
PREFILLED_SYRINGE | SUBCUTANEOUS | 2 refills | Status: AC
  Filled 2024-10-31: qty 1, fill #0
  Filled 2025-01-12 (×2): qty 1, 84d supply, fill #0

## 2024-10-29 NOTE — Progress Notes (Signed)
 See OV from 10/29/2024.  Jose Wood, PharmD, Jose Wood, CPP Clinical Pharmacist [facility] [phone]

## 2024-10-29 NOTE — Progress Notes (Signed)
 PA approved. Copay is $$3,[phone]. Signing patient up for copay card.

## 2024-10-29 NOTE — Progress Notes (Signed)
   S: Patient presents for review of their specialty medication therapy.  Patient is currently taking Skyrizi for psoriatic arthritis. Patient is managed by Dr. Defoor for this.   Adherence: confirms   Efficacy: reports that the medication works well for him.   Dosing: Plaque psoriasis, moderate to severe: SubQ: Two consecutive injections (75 mg each) for a total dose of 150 mg at weeks 0, 4, and then every 12 weeks thereafter.  Screening: TB test: completed   Monitoring: S/sx of infection: none S/sx of hypersensitivity/injection site reaction: none   O:     Lab Results  Component Value Date   WBC 9.7 06/11/2019   HGB 12.8 (L) 06/11/2019   HCT 37.1 (L) 06/11/2019   MCV 91.8 06/11/2019   PLT 157 06/11/2019      Chemistry      Component Value Date/Time   NA 136 06/12/2019 0402   K 3.7 06/12/2019 0402   CL 102 06/12/2019 0402   CO2 26 06/12/2019 0402   BUN 21 (H) 06/12/2019 0402   CREATININE 1.46 (H) 06/12/2019 0402      Component Value Date/Time   CALCIUM 8.2 (L) 06/12/2019 0402   ALKPHOS 35 (L) 02/26/2019 1041   AST 25 02/26/2019 1041   ALT 19 02/26/2019 1041   BILITOT 0.8 02/26/2019 1041       A/P: 1. Medication review: Patient currently on Skyrizi for psoriasis. Reviewed the medication with the patient, including the following: Skyrizi is a monoclonal antibody used in the treatment of psoriasis. Patient educated on purpose, proper use and potential adverse effects of Skyrizi. Possible adverse effects are infections, headache, and injection site reactions. Live vaccinations should be avoided while on therapy. SubQ: Administer the two consecutive injections subcutaneously at different anatomic locations, such as thighs, abdomen, or back of upper arms. Do not inject into areas where the skin is tender, bruised, red, hard, or affected by psoriasis. Intended for use under supervision of a health care professional; self-injection may occur after proper training (except  back of upper arms). No recommendations for any changes at this time.  Herlene Fleeta Morris, PharmD, JAQUELINE, CPP Clinical Pharmacist [facility] [phone]

## 2024-10-29 NOTE — Telephone Encounter (Signed)
 Pharmacy Patient Advocate Encounter  Received notification from [facility] that Prior Authorization for Nelma has been APPROVED from 10/29/24 to 04/27/25   PA #/Case ID/Reference #: [id number]

## 2024-10-30 ENCOUNTER — Other Ambulatory Visit: Payer: Self-pay

## 2024-10-30 DIAGNOSIS — L4050 Arthropathic psoriasis, unspecified: Secondary | ICD-10-CM | POA: Diagnosis not present

## 2024-10-30 DIAGNOSIS — G8929 Other chronic pain: Secondary | ICD-10-CM | POA: Diagnosis not present

## 2024-10-30 DIAGNOSIS — M25551 Pain in right hip: Secondary | ICD-10-CM | POA: Diagnosis not present

## 2024-10-31 ENCOUNTER — Other Ambulatory Visit: Payer: Self-pay

## 2024-11-03 DIAGNOSIS — G8929 Other chronic pain: Secondary | ICD-10-CM | POA: Diagnosis not present

## 2024-11-03 DIAGNOSIS — M25551 Pain in right hip: Secondary | ICD-10-CM | POA: Diagnosis not present

## 2024-11-03 DIAGNOSIS — L4050 Arthropathic psoriasis, unspecified: Secondary | ICD-10-CM | POA: Diagnosis not present

## 2024-11-06 ENCOUNTER — Other Ambulatory Visit: Payer: Self-pay

## 2024-11-07 ENCOUNTER — Other Ambulatory Visit: Payer: Self-pay

## 2024-11-07 MED ORDER — PREDNISONE 10 MG PO TABS
ORAL_TABLET | ORAL | 0 refills | Status: DC
  Filled 2024-11-07: qty 21, 6d supply, fill #0

## 2024-11-27 DIAGNOSIS — M48062 Spinal stenosis, lumbar region with neurogenic claudication: Secondary | ICD-10-CM | POA: Diagnosis not present

## 2024-11-27 DIAGNOSIS — M1611 Unilateral primary osteoarthritis, right hip: Secondary | ICD-10-CM | POA: Diagnosis not present

## 2024-11-27 DIAGNOSIS — M5416 Radiculopathy, lumbar region: Secondary | ICD-10-CM | POA: Diagnosis not present

## 2024-11-28 DIAGNOSIS — M1611 Unilateral primary osteoarthritis, right hip: Secondary | ICD-10-CM | POA: Diagnosis not present

## 2024-12-09 ENCOUNTER — Other Ambulatory Visit: Payer: Self-pay

## 2024-12-12 ENCOUNTER — Other Ambulatory Visit: Payer: Self-pay

## 2024-12-22 ENCOUNTER — Other Ambulatory Visit: Payer: Self-pay

## 2024-12-22 MED ORDER — PREDNISONE 10 MG PO TABS
ORAL_TABLET | ORAL | 0 refills | Status: DC
  Filled 2024-12-22: qty 21, 6d supply, fill #0

## 2024-12-26 ENCOUNTER — Other Ambulatory Visit: Payer: Self-pay

## 2024-12-30 ENCOUNTER — Other Ambulatory Visit: Payer: Self-pay

## 2024-12-30 MED ORDER — HYDROCODONE-ACETAMINOPHEN 5-325 MG PO TABS
1.0000 | ORAL_TABLET | Freq: Two times a day (BID) | ORAL | 0 refills | Status: DC | PRN
  Filled 2024-12-30: qty 14, 7d supply, fill #0

## 2024-12-31 ENCOUNTER — Other Ambulatory Visit: Payer: Self-pay | Admitting: Family Medicine

## 2024-12-31 ENCOUNTER — Ambulatory Visit: Payer: Self-pay

## 2024-12-31 ENCOUNTER — Encounter: Payer: Self-pay | Admitting: Family Medicine

## 2024-12-31 ENCOUNTER — Ambulatory Visit

## 2024-12-31 VITALS — BP 138/95 | HR 67 | Ht 72.5 in | Wt 252.4 lb

## 2024-12-31 DIAGNOSIS — M25551 Pain in right hip: Secondary | ICD-10-CM

## 2024-12-31 DIAGNOSIS — M1611 Unilateral primary osteoarthritis, right hip: Secondary | ICD-10-CM

## 2024-12-31 NOTE — Progress Notes (Signed)
 "  Office Visit Note   Patient: Jose Wood           Date of Birth: Oct 24, 1966           MRN: [id number] Visit Date: 12/31/2024              Requested by: Lenon Layman ORN, MD [address] [facility] [place],  [place] [address] PCP: Lenon Layman ORN, MD   Assessment & Plan: Visit Diagnoses:  1. Arthritis of right hip   2. Pain of right hip     Plan: Natural history and expected course discussed. Questions answered. Discussed at length the patients condition and treatment options. Patient has severe right hip arthritis and has failed conservative management. Discussed hip replacement surgery with patient; Discussed the details of surgery, the risks(which include complications from anesthesia, bleeding, infection, blood clots, leg length discrepancy, nerve damage, blood vessel and ligament damage, risk of loosening or wearing out of implants, dislocation of the prosthesis, and heart attack, stroke, or death), and the benefits; the patient would like to think about whether he wants surgery and will contact us  if and when he decides to proceed. Will perform through direct anterior approach.   Orders:  Orders Placed This Encounter  Procedures   DG Hip Unilat W OR W/O Pelvis 2-3 Views Right     Subjective: Chief Complaint: Right hip pain  HPI Patient is a 59 y.o. year old male who complains of right hip pain. Onset of the symptoms was 3 months ago. Inciting event: none. Current symptoms include groin pain and trouble weight bearing. Aggravating symptoms: walking. Patient's course of pain: gradually worsening. Patient has had no prior history of hip problems.. Treatment to date: intraarticular steroid injection.  Objective: Vital Signs: BP (!) 139/96 (Cuff Size: Large)   Pulse 74   Ht 6' 0.5 (1.842 m)   Wt 252 lb 6.4 oz (114.5 kg)   BMI 33.76 kg/m   Physical Exam Gen: Alert, No Acute Distress right hip: Skin intact, no erythema or induration noted.  groin tenderness to palpation. Limited range of motion. positive log roll. SILT DP/SP/T, 5/5 EHL/PF/DF, +2 dorsalis pedis and +2 posterior tibial    Imaging: Radiographs personally reviewed by me; reveal severe osteoarthritis of the right hip   PMFS History: Patient Active Problem List   Diagnosis Date Noted   S/P repair of recurrent ventral hernia 06/10/2019   Incisional hernia, without obstruction or gangrene 10/15/2018   Back pain without sciatica 09/24/2018   Essential hypertension 09/24/2018   Past Medical History:  Diagnosis Date   Back pain with sciatica    Hypertension    Osteoarthritis    Umbilical hernia     Family History  Problem Relation Age of Onset   Cancer Mother    Stroke Paternal Grandmother     Past Surgical History:  Procedure Laterality Date   ABDOMINAL WALL DEFECT REPAIR N/A 06/10/2019   Procedure: REPAIR ABDOMINAL WALL RECONSTRUCTION;  Surgeon: Jordis Laneta FALCON, MD;  Location: [facility] ORS;  Service: General;  Laterality: N/A;   APPENDECTOMY N/A 06/10/2019   Procedure: APPENDECTOMY;  Surgeon: Jordis Laneta FALCON, MD;  Location: [facility] ORS;  Service: General;  Laterality: N/A;   COLON SURGERY     KNEE SURGERY     VENTRAL HERNIA REPAIR N/A 06/10/2019   Procedure: HERNIA REPAIR VENTRAL ADULT;  Surgeon: Jordis Laneta FALCON, MD;  Location: [facility] ORS;  Service: General;  Laterality: N/A;   Social History  Occupational History   Not on file  Tobacco Use   Smoking status: Never   Smokeless tobacco: Never  Substance and Sexual Activity   Alcohol use: Never   Drug use: Never   Sexual activity: Not on file   Current Outpatient Medications  Medication Instructions   acetaminophen  (TYLENOL ) 650 mg, Oral, Every 8 hours PRN   baclofen  (LIORESAL ) 10 mg, Oral, 3 times daily PRN   docusate sodium  (COLACE) 100 mg, Oral, Daily   folic acid  (FOLVITE ) 1 mg, Oral, Daily   HYDROcodone -acetaminophen  (NORCO/VICODIN) 5-325 MG tablet 1 tablet, Oral, 2 times daily PRN    lisinopril -hydrochlorothiazide  (PRINZIDE ,ZESTORETIC ) 20-25 MG tablet 1 tablet, Oral, Daily   lisinopril -hydrochlorothiazide  (ZESTORETIC ) 20-25 MG tablet 1 tablet, Oral, Daily   meloxicam  (MOBIC ) 15 mg, Oral, Daily   meloxicam  (MOBIC ) 15 mg, Oral, Daily   Multiple Vitamins-Minerals (MENS MULTIVITAMIN PLUS PO) 1 tablet, Oral, Daily   polyethylene glycol (MIRALAX  / GLYCOLAX ) 17 g, Oral, Daily PRN   sildenafil  (REVATIO ) 20 mg, Oral, 3 times daily PRN   sildenafil  (REVATIO ) 20 mg, Oral, Daily PRN   sildenafil  (REVATIO ) 20 mg, Oral, Daily PRN   SKYRIZI  150 MG/ML SOSY prefilled syringe Inject 1 mL (150 mg total) subcutaneously every 3 (three) months   Allergies as of 12/31/2024   (No Known Allergies)   "

## 2025-01-01 ENCOUNTER — Other Ambulatory Visit

## 2025-01-12 ENCOUNTER — Other Ambulatory Visit (HOSPITAL_COMMUNITY): Payer: Self-pay

## 2025-01-12 ENCOUNTER — Other Ambulatory Visit: Payer: Self-pay

## 2025-01-12 NOTE — Progress Notes (Signed)
 Pharmacy Patient Advocate Encounter  Insurance verification completed.   The patient is insured through [facility]   Ran test claim for Skyrizi . Co-pay is $750.  Copay card brings copay to $630. Patient has Skyrizi  credit card   This test claim was processed through [facility]- copay amounts may vary at other pharmacies due to pharmacy/plan contracts, or as the patient moves through the different stages of their insurance plan.

## 2025-01-12 NOTE — Progress Notes (Signed)
 Specialty Pharmacy Initial Fill Coordination Note  Jose Wood is a 59 y.o. male contacted today regarding initial fill of specialty medication(s) Risankizumab -rzaa (Skyrizi )   Patient requested [name] at [facility] at [place] date: 01/14/25   Medication will be filled on: 01/13/25    Patient is aware of $0 copayment. Patient has Skyrizi  copay card and credit card

## 2025-01-13 ENCOUNTER — Other Ambulatory Visit: Payer: Self-pay

## 2025-01-18 ENCOUNTER — Other Ambulatory Visit: Payer: Self-pay

## 2025-01-19 ENCOUNTER — Other Ambulatory Visit: Payer: Self-pay

## 2025-01-20 ENCOUNTER — Other Ambulatory Visit: Payer: Self-pay

## 2025-01-20 ENCOUNTER — Other Ambulatory Visit (HOSPITAL_COMMUNITY): Payer: Self-pay

## 2025-01-20 MED ORDER — LISINOPRIL-HYDROCHLOROTHIAZIDE 20-25 MG PO TABS
1.0000 | ORAL_TABLET | Freq: Every day | ORAL | 0 refills | Status: DC
  Filled 2025-01-20 – 2025-01-21 (×2): qty 90, 90d supply, fill #0

## 2025-01-21 ENCOUNTER — Other Ambulatory Visit: Payer: Self-pay

## 2025-01-21 ENCOUNTER — Other Ambulatory Visit (HOSPITAL_COMMUNITY): Payer: Self-pay

## 2025-01-21 MED ORDER — TIZANIDINE HCL 2 MG PO TABS
2.0000 mg | ORAL_TABLET | Freq: Three times a day (TID) | ORAL | 0 refills | Status: AC | PRN
  Filled 2025-01-21: qty 30, 10d supply, fill #0

## 2025-01-22 ENCOUNTER — Ambulatory Visit: Admitting: Family Medicine

## 2025-01-23 ENCOUNTER — Other Ambulatory Visit: Payer: Self-pay

## 2025-01-23 MED ORDER — LISINOPRIL-HYDROCHLOROTHIAZIDE 20-25 MG PO TABS: 1.0000 | ORAL_TABLET | Freq: Every day | ORAL | 3 refills | Status: AC
